# Patient Record
Sex: Female | Born: 1950 | Race: White | Hispanic: No | Marital: Married | Smoking: Never smoker
Health system: Southern US, Community
[De-identification: ages and names within clinical notes are randomized; demographics above are authoritative.]

## PROBLEM LIST (undated history)

## (undated) DIAGNOSIS — L719 Rosacea, unspecified: Secondary | ICD-10-CM

## (undated) DIAGNOSIS — Z9289 Personal history of other medical treatment: Secondary | ICD-10-CM

## (undated) DIAGNOSIS — G4700 Insomnia, unspecified: Secondary | ICD-10-CM

## (undated) DIAGNOSIS — E785 Hyperlipidemia, unspecified: Secondary | ICD-10-CM

## (undated) DIAGNOSIS — K5790 Diverticulosis of intestine, part unspecified, without perforation or abscess without bleeding: Secondary | ICD-10-CM

## (undated) DIAGNOSIS — R748 Abnormal levels of other serum enzymes: Secondary | ICD-10-CM

## (undated) DIAGNOSIS — G43109 Migraine with aura, not intractable, without status migrainosus: Secondary | ICD-10-CM

## (undated) HISTORY — DX: Migraine with aura, not intractable, without status migrainosus: G43.109

## (undated) HISTORY — DX: Diverticulosis of intestine, part unspecified, without perforation or abscess without bleeding: K57.90

## (undated) HISTORY — DX: Insomnia, unspecified: G47.00

## (undated) HISTORY — DX: Rosacea, unspecified: L71.9

## (undated) HISTORY — DX: Personal history of other medical treatment: Z92.89

## (undated) HISTORY — DX: Hyperlipidemia, unspecified: E78.5

## (undated) HISTORY — DX: Abnormal levels of other serum enzymes: R74.8

---

## 1994-11-18 HISTORY — PX: KNEE SURGERY: SHX244

## 1998-04-14 ENCOUNTER — Other Ambulatory Visit: Admission: RE | Admit: 1998-04-14 | Discharge: 1998-04-14 | Payer: Self-pay | Admitting: *Deleted

## 1998-05-18 ENCOUNTER — Other Ambulatory Visit: Admission: RE | Admit: 1998-05-18 | Discharge: 1998-05-18 | Payer: Self-pay | Admitting: *Deleted

## 1999-02-16 ENCOUNTER — Other Ambulatory Visit: Admission: RE | Admit: 1999-02-16 | Discharge: 1999-02-16 | Payer: Self-pay | Admitting: *Deleted

## 1999-07-04 ENCOUNTER — Other Ambulatory Visit: Admission: RE | Admit: 1999-07-04 | Discharge: 1999-07-04 | Payer: Self-pay | Admitting: *Deleted

## 2000-09-23 ENCOUNTER — Other Ambulatory Visit: Admission: RE | Admit: 2000-09-23 | Discharge: 2000-09-23 | Payer: Self-pay | Admitting: *Deleted

## 2001-12-14 ENCOUNTER — Other Ambulatory Visit: Admission: RE | Admit: 2001-12-14 | Discharge: 2001-12-14 | Payer: Self-pay | Admitting: *Deleted

## 2002-11-18 DIAGNOSIS — G43109 Migraine with aura, not intractable, without status migrainosus: Secondary | ICD-10-CM

## 2002-11-18 HISTORY — DX: Migraine with aura, not intractable, without status migrainosus: G43.109

## 2002-12-23 ENCOUNTER — Other Ambulatory Visit: Admission: RE | Admit: 2002-12-23 | Discharge: 2002-12-23 | Payer: Self-pay | Admitting: *Deleted

## 2003-01-03 ENCOUNTER — Encounter: Payer: Self-pay | Admitting: Family Medicine

## 2003-01-03 ENCOUNTER — Encounter: Admission: RE | Admit: 2003-01-03 | Discharge: 2003-01-03 | Payer: Self-pay | Admitting: Family Medicine

## 2003-06-14 ENCOUNTER — Ambulatory Visit (HOSPITAL_COMMUNITY): Admission: RE | Admit: 2003-06-14 | Discharge: 2003-06-14 | Payer: Self-pay | Admitting: Neurology

## 2003-06-14 ENCOUNTER — Encounter: Payer: Self-pay | Admitting: Neurology

## 2004-03-26 ENCOUNTER — Other Ambulatory Visit: Admission: RE | Admit: 2004-03-26 | Discharge: 2004-03-26 | Payer: Self-pay | Admitting: *Deleted

## 2004-11-18 DIAGNOSIS — R748 Abnormal levels of other serum enzymes: Secondary | ICD-10-CM

## 2004-11-18 DIAGNOSIS — K5790 Diverticulosis of intestine, part unspecified, without perforation or abscess without bleeding: Secondary | ICD-10-CM

## 2004-11-18 HISTORY — PX: EYE MUSCLE SURGERY: SHX370

## 2004-11-18 HISTORY — DX: Diverticulosis of intestine, part unspecified, without perforation or abscess without bleeding: K57.90

## 2004-11-18 HISTORY — DX: Abnormal levels of other serum enzymes: R74.8

## 2004-11-18 HISTORY — PX: CATARACT EXTRACTION: SUR2

## 2005-04-16 ENCOUNTER — Other Ambulatory Visit: Admission: RE | Admit: 2005-04-16 | Discharge: 2005-04-16 | Payer: Self-pay | Admitting: *Deleted

## 2005-06-12 HISTORY — PX: COLONOSCOPY: SHX174

## 2006-04-30 ENCOUNTER — Other Ambulatory Visit: Admission: RE | Admit: 2006-04-30 | Discharge: 2006-04-30 | Payer: Self-pay | Admitting: *Deleted

## 2007-06-09 ENCOUNTER — Other Ambulatory Visit: Admission: RE | Admit: 2007-06-09 | Discharge: 2007-06-09 | Payer: Self-pay | Admitting: *Deleted

## 2008-07-20 ENCOUNTER — Other Ambulatory Visit: Admission: RE | Admit: 2008-07-20 | Discharge: 2008-07-20 | Payer: Self-pay | Admitting: Gynecology

## 2011-11-19 DIAGNOSIS — E785 Hyperlipidemia, unspecified: Secondary | ICD-10-CM

## 2011-11-19 HISTORY — DX: Hyperlipidemia, unspecified: E78.5

## 2011-12-11 DIAGNOSIS — Z9289 Personal history of other medical treatment: Secondary | ICD-10-CM

## 2011-12-11 HISTORY — DX: Personal history of other medical treatment: Z92.89

## 2012-11-18 DIAGNOSIS — L719 Rosacea, unspecified: Secondary | ICD-10-CM

## 2012-11-18 HISTORY — DX: Rosacea, unspecified: L71.9

## 2012-12-09 ENCOUNTER — Other Ambulatory Visit: Payer: Self-pay | Admitting: Family Medicine

## 2012-12-09 ENCOUNTER — Ambulatory Visit
Admission: RE | Admit: 2012-12-09 | Discharge: 2012-12-09 | Disposition: A | Discharge status: Home / Self Care | Payer: BC Managed Care – PPO | Source: Ambulatory Visit | Attending: Family Medicine | Admitting: Family Medicine

## 2012-12-09 DIAGNOSIS — M25559 Pain in unspecified hip: Secondary | ICD-10-CM

## 2012-12-09 DIAGNOSIS — M545 Low back pain: Secondary | ICD-10-CM

## 2014-12-13 DIAGNOSIS — Z9289 Personal history of other medical treatment: Secondary | ICD-10-CM

## 2014-12-13 HISTORY — DX: Personal history of other medical treatment: Z92.89

## 2015-01-03 ENCOUNTER — Telehealth: Payer: Self-pay | Admitting: Cardiology

## 2015-01-03 NOTE — Telephone Encounter (Signed)
Received records from Dr Mosetta PuttPeter Blomgren for appointment on 01/04/15 with Dr Antoine PocheHochrein.  Records given to N. Hines (medical records) for Dr Hochrein's schedule on 01/04/15.  lp

## 2015-01-04 ENCOUNTER — Encounter: Payer: Self-pay | Admitting: Cardiology

## 2015-01-04 ENCOUNTER — Ambulatory Visit (INDEPENDENT_AMBULATORY_CARE_PROVIDER_SITE_OTHER): Payer: No Typology Code available for payment source | Admitting: Cardiology

## 2015-01-04 VITALS — BP 122/86 | HR 62 | Ht 63.0 in | Wt 156.0 lb

## 2015-01-04 DIAGNOSIS — R0789 Other chest pain: Secondary | ICD-10-CM

## 2015-01-04 NOTE — Progress Notes (Signed)
Cardiology Office Note   Date:  01/04/2015   ID:  Chelsea Cline, DOB November 18, 1951, MRN [id number]  PCP:  Carolyne Fiscal, MD  Cardiologist:   Rollene Rotunda, MD   Chief Complaint  Patient presents with  . New Patient    Pt was referred by Dr Geoffery Lyons for abnormal EKG      History of Present Illness: Chelsea Cline is a 64 y.o. female who presents for evaluation of an abnormal EKG. The patient has no prior cardiac history. She does report having had a stress test several years ago that she was told was normal. She was having some rapid heart rate with exercise at that time. She's now noted to have an abnormal EKG with some T-wave inversion in the anterior leads and an incomplete right bundle branch block. This was apparently new compared with previous EKGs.  The patient has no cardiac symptoms. She is active. She exercises and plays doubles tennis. With this she denies any cardiovascular symptoms. The patient denies any new symptoms such as chest discomfort, neck or arm discomfort. There has been no new shortness of breath, PND or orthopnea. There have been no reported palpitations, presyncope or syncope.      Past Medical History  Diagnosis Date  . Ocular migraine 2004  . Insomnia     Several years  . Elevated alkaline phosphatase level 2006  . Diverticulosis 2006    Noted on Colonoscopy which has been asymptomatic.  Marland Kitchen Hyperlipidemia 2013  . Rosacea 2014  . H/O mammogram 12/13/2014  . H/O bone density study 12/11/2011    Osteopenia    Past Surgical History  Procedure Laterality Date  . Colonoscopy  06/12/2005    Showed minimal diverticulosis and a Hyperplastic polyp.  . Knee surgery Left 1996    for Chondromalacia  . Eye muscle surgery Left 2006    for repair of a macular pucker  . Cataract extraction Left 2006    Lens Implant     Current Outpatient Prescriptions  Medication Sig Dispense Refill  . ALPRAZolam (XANAX) 0.25 MG tablet Take 0.25 mg by mouth at  bedtime as needed for anxiety.    Marland Kitchen aspirin 81 MG tablet Take 81 mg by mouth daily.    . Calcium Carbonate-Vitamin D (CALTRATE 600+D PO) Take 2 tablets by mouth daily.    . Glucosamine-Chondroitin (COSAMIN DS PO) Take 1-2 capsules by mouth daily.    Marland Kitchen lovastatin (MEVACOR) 10 MG tablet Take 10 mg by mouth at bedtime.    . multivitamin-lutein (OCUVITE-LUTEIN) CAPS capsule Take 1 capsule by mouth daily.     No current facility-administered medications for this visit.    Allergies:   Review of patient's allergies indicates no known allergies.    Social History:  The patient  reports that she has never smoked. She does not have any smokeless tobacco history on file. She reports that she drinks about 3.0 oz of alcohol per week. She reports that she does not use illicit drugs.   Family History:  The patient's family history includes Cancer - Other in her mother; Diabetes in her father; Hypertension in her father; Stroke in her mother.    ROS:  Please see the history of present illness.   Otherwise, review of systems are positive for occasional leg swelling, tinnitus..   All other systems are reviewed and negative.    PHYSICAL EXAM: VS:  BP 122/86 mmHg  Pulse 62  Ht  (1.6 m)  Wt 156  lb (70.761 kg)  BMI 27.64 kg/m2 , BMI Body mass index is 27.64 kg/(m^2). GEN: Well nourished, well developed, in no acute distress HEENT: normal Neck: no JVD, carotid bruits, or masses Cardiac: RRR; no murmurs, rubs, or gallops,no edema  Respiratory:  clear to auscultation bilaterally, normal work of breathing GI: soft, nontender, nondistended, + BS MS: no deformity or atrophy Skin: warm and dry, no rash Neuro:  Strength and sensation are intact Psych: euthymic mood, full affect   EKG:  EKG is ordered today. The ekg ordered today demonstrates sinus rhythm, rate 62, incomplete right bundle branch block, T-wave inversion in the anterior leads unchanged from the January EKG..    Lipid Panel Total  cholesterol 249, triglycerides 100, LDL 154 HDL 72    Wt Readings from Last 3 Encounters:  01/04/15 156 lb (70.761 kg)      Other studies Reviewed: Additional studies/ records that were reviewed today include: Office records from Carolyne FiscalBLOMGREN,PETER F, MD. Review of the above records demonstrates: As above.    ASSESSMENT AND PLAN: ABNORMAL EKG:  The patient does have an abnormal EKG but no symptoms. She does have possible risk factors of family history although her fathers's  sudden death was of unclear etiology. She does need to be screened. I will bring her back for a POET (Plain Old Exercise Treadmill).    DYSLIPIDEMIA:  She does have dyslipidemia as above. I agree with statin treatment if one is found that she tolerates. I think she meets criteria for primary risk reduction  Current medicines are reviewed at length with the patient today.  The patient does not have concerns regarding medicines.  The following changes have been made:  no change  Labs/ tests ordered today include: POET (Plain Old Exercise Treadmill)  No orders of the defined types were placed in this encounter.     Disposition:   FU with me as needed.   Signed, Rollene RotundaJames Tyshawn Ciullo, MD  01/04/2015 4:26 PM    [facility]

## 2015-01-04 NOTE — Patient Instructions (Signed)
Your physician recommends that you schedule a follow-up appointment in: as needed with Dr. Hochrein  We are ordering a stress test for you to get done    

## 2015-01-17 ENCOUNTER — Telehealth: Payer: Self-pay | Admitting: Cardiology

## 2015-01-26 ENCOUNTER — Telehealth (HOSPITAL_COMMUNITY): Payer: Self-pay

## 2015-01-26 NOTE — Telephone Encounter (Signed)
Encounter complete. 

## 2015-01-30 ENCOUNTER — Ambulatory Visit: Payer: Self-pay | Admitting: Cardiology

## 2015-01-31 ENCOUNTER — Ambulatory Visit (HOSPITAL_COMMUNITY)
Admission: RE | Admit: 2015-01-31 | Discharge: 2015-01-31 | Disposition: A | Discharge status: Home / Self Care | Payer: Medicare Other | Source: Ambulatory Visit | Attending: Cardiology | Admitting: Cardiology

## 2015-01-31 DIAGNOSIS — R0789 Other chest pain: Secondary | ICD-10-CM | POA: Diagnosis not present

## 2015-01-31 NOTE — Procedures (Signed)
Exercise Treadmill Test Test  Exercise Tolerance Test Ordering MD: Angelina SheriffJake Hochrein, MD    Unique Test No: 1  Treadmill:  1  Indication for ETT: ABN EKG  Contraindication to ETT: No   Stress Modality: exercise - treadmill  Cardiac Imaging Performed: non   Protocol: standard Bruce - maximal  Max BP:  181/106  Max MPHR (bpm):  156 85% MPR (bpm):  133  MPHR obtained (bpm):  162 % MPHR obtained:  103  Reached 85% MPHR (min:sec):  4:30 Total Exercise Time (min-sec):  8  Workload in METS:  10.1 Borg Scale: 14-15  Reason ETT Terminated:  SOB, THR obtained    ST Segment Analysis At Rest: NSR, IRBBB, nonspecific ST changes With Exercise: no evidence of significant ST depression  Other Information Arrhythmia:  Rare PAC. Angina during ETT:  absent (0) Quality of ETT:  diagnostic  ETT Interpretation:  normal - no evidence of ischemia by ST analysis  Comments: ETT with fair exercise tolerance (8:00); normal BP response; no chest pain; no ST changes; negative adequate ETT.  Olga MillersBrian Crenshaw

## 2015-06-07 NOTE — Telephone Encounter (Signed)
Error

## 2016-04-16 ENCOUNTER — Other Ambulatory Visit: Payer: Self-pay | Admitting: Family Medicine

## 2016-04-16 DIAGNOSIS — R109 Unspecified abdominal pain: Secondary | ICD-10-CM

## 2016-04-17 ENCOUNTER — Ambulatory Visit
Admission: RE | Admit: 2016-04-17 | Discharge: 2016-04-17 | Disposition: A | Discharge status: Home / Self Care | Payer: Medicare Other | Source: Ambulatory Visit | Attending: Family Medicine | Admitting: Family Medicine

## 2016-04-17 DIAGNOSIS — R109 Unspecified abdominal pain: Secondary | ICD-10-CM

## 2018-02-18 DIAGNOSIS — H9313 Tinnitus, bilateral: Secondary | ICD-10-CM | POA: Insufficient documentation

## 2018-10-09 DIAGNOSIS — M25569 Pain in unspecified knee: Secondary | ICD-10-CM | POA: Insufficient documentation

## 2018-11-09 DIAGNOSIS — M179 Osteoarthritis of knee, unspecified: Secondary | ICD-10-CM | POA: Insufficient documentation

## 2018-11-09 DIAGNOSIS — M1710 Unilateral primary osteoarthritis, unspecified knee: Secondary | ICD-10-CM | POA: Insufficient documentation

## 2018-12-18 DIAGNOSIS — M2392 Unspecified internal derangement of left knee: Secondary | ICD-10-CM | POA: Insufficient documentation

## 2019-05-04 ENCOUNTER — Other Ambulatory Visit: Payer: Self-pay | Admitting: Family Medicine

## 2019-05-04 ENCOUNTER — Ambulatory Visit
Admission: RE | Admit: 2019-05-04 | Discharge: 2019-05-04 | Disposition: A | Discharge status: Home / Self Care | Payer: Medicare Other | Source: Ambulatory Visit | Attending: Family Medicine | Admitting: Family Medicine

## 2019-05-04 DIAGNOSIS — M25552 Pain in left hip: Secondary | ICD-10-CM

## 2019-05-27 DIAGNOSIS — H8111 Benign paroxysmal vertigo, right ear: Secondary | ICD-10-CM | POA: Insufficient documentation

## 2019-06-01 ENCOUNTER — Other Ambulatory Visit: Payer: Self-pay

## 2019-06-01 ENCOUNTER — Ambulatory Visit (INDEPENDENT_AMBULATORY_CARE_PROVIDER_SITE_OTHER): Payer: Medicare Other | Admitting: Physical Medicine and Rehabilitation

## 2019-06-01 ENCOUNTER — Encounter: Payer: Self-pay | Admitting: Physical Medicine and Rehabilitation

## 2019-06-01 ENCOUNTER — Ambulatory Visit: Payer: Self-pay

## 2019-06-01 DIAGNOSIS — G8929 Other chronic pain: Secondary | ICD-10-CM

## 2019-06-01 DIAGNOSIS — M7062 Trochanteric bursitis, left hip: Secondary | ICD-10-CM

## 2019-06-01 DIAGNOSIS — M7918 Myalgia, other site: Secondary | ICD-10-CM

## 2019-06-01 DIAGNOSIS — M25562 Pain in left knee: Secondary | ICD-10-CM

## 2019-06-01 DIAGNOSIS — M545 Low back pain, unspecified: Secondary | ICD-10-CM

## 2019-06-01 NOTE — Progress Notes (Signed)
.  Numeric Pain Rating Scale and Functional Assessment Average Pain 7   In the last MONTH (on 0-10 scale) has pain interfered with the following?  1. General activity like being  able to carry out your everyday physical activities such as walking, climbing stairs, carrying groceries, or moving a chair?  Rating(4)   -Dye Allergies.  

## 2019-06-01 NOTE — Progress Notes (Signed)
Chelsea Cline - 68 y.o. female MRN [id number]  Date of birth: May 09, 1951  Office Visit Note: Visit Date: 06/01/2019 PCP: Derinda Late, MD Referred by: Derinda Late, MD  Subjective: Chief Complaint  Patient presents with   Left Hip - Pain   HPI:  Chelsea Cline is a 68 y.o. female who comes in today At the request of Dr. Derinda Late for evaluation management of left lateral hip pain.  She reports history of 3 months of worsening lateral hip pain.  She denies any specific injury or trauma.  She denies any groin pain.  She has no paresthesias numbness or tingling or focal weakness.  She really has difficulty laying on the left side and that makes the pain tremendously worse.  She denies any right-sided complaints.  She does report that walking actually seems to help the pain to a degree.  She rates her pain as a 7 out of 10.  She has failed medication management and is pretty active although she is not had specific physical therapy for the hip.  She also reports left knee osteoarthritis which is treated from an orthopedic standpoint at [facility] near [facility] where she lives.  Review of Systems  Constitutional: Negative for chills, fever, malaise/fatigue and weight loss.  HENT: Negative for hearing loss and sinus pain.   Eyes: Negative for blurred vision, double vision and photophobia.  Respiratory: Negative for cough and shortness of breath.   Cardiovascular: Negative for chest pain, palpitations and leg swelling.  Gastrointestinal: Negative for abdominal pain, nausea and vomiting.  Genitourinary: Negative for flank pain.  Musculoskeletal: Positive for joint pain. Negative for myalgias.  Skin: Negative for itching and rash.  Neurological: Negative for tremors, focal weakness and weakness.  Endo/Heme/Allergies: Negative.   Psychiatric/Behavioral: Negative for depression.  All other systems reviewed and are negative.  Otherwise per HPI.  Assessment & Plan: Visit  Diagnoses:  1. Greater trochanteric bursitis, left   2. Chronic right-sided low back pain without sciatica   3. Myofascial pain syndrome   4. Chronic pain of left knee     Plan: Findings:  Clinical exam findings and history consistent with a left greater trochanteric pain syndrome or bursitis.  We did spend time talking about natural history of bursitis and sources of pain and at least how to treat that.  I think the fact that she is really not gotten much relief and this is pretty problematic at this point with a pain scale of 7 out of 10 it is worth completing diagnostic and hopefully therapeutic injection with fluoroscopic guidance.  Fluoroscopic guidance is justified given body habitus.  Depending on relief could look at foam roller and physical therapy consultation.  Repeat injection could be done if it is symptomatic relief for a good while.  If there is no relief further work-up would be needed for potential for radiculitis.    Meds & Orders: No orders of the defined types were placed in this encounter.   Orders Placed This Encounter  Procedures   Large Joint Inj: L greater trochanter   XR C-ARM NO REPORT    Follow-up: Return if symptoms worsen or fail to improve.   Procedures: Large Joint Inj: L greater trochanter on 06/01/2019 2:48 PM Indications: pain and diagnostic evaluation Details: 22 G 3.5 in needle, fluoroscopy-guided lateral approach  Arthrogram: No  Medications: 4 mL bupivacaine 0.25 %; 4 mL lidocaine 2 %; 80 mg triamcinolone acetonide 40 MG/ML Outcome: tolerated well, no immediate complications  There was excellent flow of contrast outlined the greater trochanteric bursa without vascular uptake. Procedure, treatment alternatives, risks and benefits explained, specific risks discussed. Consent was given by the patient. Immediately prior to procedure a time out was called to verify the correct patient, procedure, equipment, support staff and site/side marked as  required. Patient was prepped and draped in the usual sterile fashion.      No notes on file   Clinical History: No specialty comments available.     Objective:  VS:  HT:     WT:    BMI:      BP:    HR: bpm   TEMP: ( )   RESP:  Physical Exam Vitals signs and nursing note reviewed.  Constitutional:      General: She is not in acute distress.    Appearance: Normal appearance. She is well-developed.  HENT:     Head: Normocephalic and atraumatic.     Nose: Nose normal.     Mouth/Throat:     Pharynx: Oropharynx is clear.  Eyes:     Conjunctiva/sclera: Conjunctivae normal.     Pupils: Pupils are equal, round, and reactive to light.  Neck:     Musculoskeletal: Normal range of motion and neck supple.  Cardiovascular:     Rate and Rhythm: Regular rhythm.  Pulmonary:     Effort: Pulmonary effort is normal. No respiratory distress.  Abdominal:     General: There is no distension.     Palpations: Abdomen is soft.     Tenderness: There is no guarding.  Musculoskeletal:     Right lower leg: No edema.     Left lower leg: No edema.     Comments: Patient has very little pain on extension and facet loading of the lumbar spine and there is no pain with hip rotation but there is pain over the greater trochanter that is pretty specific on the left side and is concordant with her pain.  She has good distal strength.  Skin:    General: Skin is warm and dry.     Findings: No erythema or rash.  Neurological:     General: No focal deficit present.     Mental Status: She is alert and oriented to person, place, and time.     Sensory: No sensory deficit.     Motor: No abnormal muscle tone.     Coordination: Coordination normal.     Gait: Gait normal.  Psychiatric:        Mood and Affect: Mood normal.        Behavior: Behavior normal.        Thought Content: Thought content normal.     Ortho Exam Imaging: No results found.

## 2019-06-03 ENCOUNTER — Encounter: Payer: Self-pay | Admitting: Physical Medicine and Rehabilitation

## 2019-06-03 MED ORDER — LIDOCAINE HCL 2 % IJ SOLN
4.0000 mL | INTRAMUSCULAR | Status: AC | PRN
  Administered 2019-06-01: 4 mL

## 2019-06-03 MED ORDER — TRIAMCINOLONE ACETONIDE 40 MG/ML IJ SUSP
80.0000 mg | INTRAMUSCULAR | Status: AC | PRN
  Administered 2019-06-01: 80 mg via INTRA_ARTICULAR

## 2019-06-03 MED ORDER — BUPIVACAINE HCL 0.25 % IJ SOLN
4.0000 mL | INTRAMUSCULAR | Status: AC | PRN
  Administered 2019-06-01: 4 mL via INTRA_ARTICULAR

## 2019-10-08 ENCOUNTER — Other Ambulatory Visit: Payer: Self-pay

## 2019-10-08 ENCOUNTER — Ambulatory Visit: Payer: Self-pay

## 2019-10-08 ENCOUNTER — Ambulatory Visit (INDEPENDENT_AMBULATORY_CARE_PROVIDER_SITE_OTHER): Payer: Medicare Other | Admitting: Physical Medicine and Rehabilitation

## 2019-10-08 ENCOUNTER — Encounter: Payer: Self-pay | Admitting: Physical Medicine and Rehabilitation

## 2019-10-08 DIAGNOSIS — M25552 Pain in left hip: Secondary | ICD-10-CM

## 2019-10-08 NOTE — Progress Notes (Signed)
.  Numeric Pain Rating Scale and Functional Assessment Average Pain 4   In the last MONTH (on 0-10 scale) has pain interfered with the following?  1. General activity like being  able to carry out your everyday physical activities such as walking, climbing stairs, carrying groceries, or moving a chair?  Rating(5)    -Dye Allergies.  

## 2019-10-11 DIAGNOSIS — M25552 Pain in left hip: Secondary | ICD-10-CM

## 2019-10-11 MED ORDER — BUPIVACAINE HCL 0.25 % IJ SOLN
4.0000 mL | INTRAMUSCULAR | Status: AC | PRN
  Administered 2019-10-11: 06:00:00 4 mL via INTRA_ARTICULAR

## 2019-10-11 MED ORDER — TRIAMCINOLONE ACETONIDE 40 MG/ML IJ SUSP
60.0000 mg | INTRAMUSCULAR | Status: AC | PRN
  Administered 2019-10-11: 06:00:00 60 mg via INTRA_ARTICULAR

## 2019-10-11 NOTE — Progress Notes (Signed)
Chelsea Cline - 68 y.o. female MRN [id number]  Date of birth: 1951-05-14  Office Visit Note: Visit Date: 10/08/2019 PCP: Mosetta Putt, MD Referred by: Mosetta Putt, MD  Subjective: Chief Complaint  Patient presents with  . Left Hip - Pain  . Left Thigh - Pain   HPI:  Chelsea Cline is a 68 y.o. female who comes in today For planned diagnostic and hopefully therapeutic anesthetic hip arthrogram on the left.  Patient comes in at the request of Dr. Mosetta Putt with worsening hip pain but now some groin pain with movement.  Some referral pain into the thigh without tingling numbness or paresthesia.  She does feel like there was a point where maybe she did rotate with the hip and this seemed to have started back in September of this year.  Interestingly she does get some symptoms of she has been sitting for a long time.  Average pain is 4 out of 10.  I seen her in the past for greater trochanteric injection which was beneficial but now her symptoms have changed.  Depending on relief with the injection would suggest MRI arthrogram of the hip joint.  X-ray of the hip in June showed only mild degenerative change.  ROS Otherwise per HPI.  Assessment & Plan: Visit Diagnoses:  1. Pain in left hip     Plan: Findings:  Excellent arthrogram of the joint showing no extravasation and the patient did seem to have some relief during the anesthetic phase of the injection.  She is going to monitor this today and then see how she does with the cortisone part of the injection.  She can follow-up with myself or Dr. Duaine Dredge.    Meds & Orders: No orders of the defined types were placed in this encounter.   Orders Placed This Encounter  Procedures  . C-ARM NO Order    Follow-up: No follow-ups on file.   Procedures: Large Joint Inj: L hip joint on 10/11/2019 5:48 AM Indications: pain and diagnostic evaluation Details: 22 G needle, anterior approach  Arthrogram: Yes  Medications: 4 mL  bupivacaine 0.25 %; 60 mg triamcinolone acetonide 40 MG/ML Outcome: tolerated well, no immediate complications  Arthrogram demonstrated excellent flow of contrast throughout the joint surface without extravasation or obvious defect.  The patient had relief of symptoms during the anesthetic phase of the injection.  Procedure, treatment alternatives, risks and benefits explained, specific risks discussed. Consent was given by the patient. Immediately prior to procedure a time out was called to verify the correct patient, procedure, equipment, support staff and site/side marked as required. Patient was prepped and draped in the usual sterile fashion.      No notes on file   Clinical History: DG HIP (WITH OR WITHOUT PELVIS) 2-3V LEFT  COMPARISON:  None.  FINDINGS: There is no acute displaced fracture or dislocation. There are mild degenerative changes of the left hip. The osseous mineralization is within normal limits. There is a metallic foreign body that projects over the midline sacrum.  IMPRESSION: 1. No acute displaced fracture or dislocation. 2. Mild degenerative changes are noted of the left hip. 3. Metallic foreign body projects over the midline sacrum. This may be external to the patient but should be correlated with physical exam.   Electronically Signed   By: Katherine Mantle M.D.   On: 05/04/2019 13:47     Objective:  VS:  HT:    WT:   BMI:     BP:  HR: bpm  TEMP: ( )  RESP:  Physical Exam Musculoskeletal:     Right lower leg: No edema.     Left lower leg: No edema.     Comments: Some discomfort with hip rotation but not exquisite.  Some pain over the greater trochanter.  Good distal strength.  Skin:    General: Skin is warm and dry.     Findings: No erythema, lesion or rash.  Neurological:     General: No focal deficit present.  Psychiatric:        Mood and Affect: Mood normal.        Behavior: Behavior normal.     Ortho Exam Imaging: No  results found.

## 2019-10-26 ENCOUNTER — Other Ambulatory Visit: Payer: Self-pay | Admitting: Family Medicine

## 2019-10-26 DIAGNOSIS — M25552 Pain in left hip: Secondary | ICD-10-CM

## 2019-11-03 ENCOUNTER — Ambulatory Visit
Admission: RE | Admit: 2019-11-03 | Discharge: 2019-11-03 | Disposition: A | Discharge status: Home / Self Care | Payer: Medicare Other | Source: Ambulatory Visit | Attending: Family Medicine | Admitting: Family Medicine

## 2019-11-03 ENCOUNTER — Other Ambulatory Visit: Payer: Self-pay

## 2019-11-03 DIAGNOSIS — M25552 Pain in left hip: Secondary | ICD-10-CM

## 2019-11-29 ENCOUNTER — Ambulatory Visit (INDEPENDENT_AMBULATORY_CARE_PROVIDER_SITE_OTHER): Payer: Medicare Other | Admitting: Physical Medicine and Rehabilitation

## 2019-11-29 ENCOUNTER — Encounter: Payer: Self-pay | Admitting: Physical Medicine and Rehabilitation

## 2019-11-29 ENCOUNTER — Ambulatory Visit: Payer: Self-pay

## 2019-11-29 ENCOUNTER — Other Ambulatory Visit: Payer: Self-pay

## 2019-11-29 DIAGNOSIS — M461 Sacroiliitis, not elsewhere classified: Secondary | ICD-10-CM | POA: Insufficient documentation

## 2019-11-29 DIAGNOSIS — S76012D Strain of muscle, fascia and tendon of left hip, subsequent encounter: Secondary | ICD-10-CM

## 2019-11-29 DIAGNOSIS — M79652 Pain in left thigh: Secondary | ICD-10-CM | POA: Diagnosis not present

## 2019-11-29 DIAGNOSIS — M47818 Spondylosis without myelopathy or radiculopathy, sacral and sacrococcygeal region: Secondary | ICD-10-CM | POA: Diagnosis not present

## 2019-11-29 DIAGNOSIS — M7062 Trochanteric bursitis, left hip: Secondary | ICD-10-CM

## 2019-11-29 DIAGNOSIS — S76012A Strain of muscle, fascia and tendon of left hip, initial encounter: Secondary | ICD-10-CM | POA: Insufficient documentation

## 2019-11-29 MED ORDER — BUPIVACAINE HCL 0.5 % IJ SOLN
3.0000 mL | INTRAMUSCULAR | Status: AC | PRN
  Administered 2019-11-29: 3 mL via INTRA_ARTICULAR

## 2019-11-29 MED ORDER — BUPIVACAINE HCL 0.25 % IJ SOLN
4.0000 mL | INTRAMUSCULAR | Status: AC | PRN
  Administered 2019-11-29: 4 mL via INTRA_ARTICULAR

## 2019-11-29 MED ORDER — TRIAMCINOLONE ACETONIDE 40 MG/ML IJ SUSP
40.0000 mg | INTRAMUSCULAR | Status: AC | PRN
  Administered 2019-11-29: 40 mg via INTRA_ARTICULAR

## 2019-11-29 NOTE — Progress Notes (Signed)
.  Numeric Pain Rating Scale and Functional Assessment Average Pain 5   In the last MONTH (on 0-10 scale) has pain interfered with the following?  1. General activity like being  able to carry out your everyday physical activities such as walking, climbing stairs, carrying groceries, or moving a chair?  Rating(8)   -Dye Allergies.  

## 2019-11-29 NOTE — Progress Notes (Signed)
Chelsea Cline - 69 y.o. female MRN [id number]  Date of birth: [birth year]  Office Visit Note: Visit Date: 11/29/2019 PCP: Derinda Late, MD Referred by: Derinda Late, MD  Subjective: Chief Complaint  Patient presents with  . Left Hip - Pain  . Left Leg - Pain   HPI: Chelsea Cline is a 69 y.o. female who comes in today For reevaluation management of the left buttock hip and thigh pain which is chronic and worsening.  This is requested by her primary care physician Dr. Derinda Late.  The last time I saw her we did complete intra-articular anesthetic hip arthrogram with questionable relief during the anesthetic phase but the patient says really did not seem to help that much overall.  Prior greater trochanteric bursa injection under fluoroscopic guidance was a big relief.  Since have seen her she has had MRI of the hip without contrast.  This is reviewed with the patient and reviewed below.  As of note it does show significant tear of the gluteus medius tendon insertion to the greater trochanter.  There is also bursitis fluid.  Not much in the way of internal derangement of the hip.  She still complains of left posterior buttock pain referral into the trochanteric area into the thigh to the knee.  No real groin pain.  No paresthesias.  No real pain past the knee.  Worse with walking and standing and going from sit to stand to sitting for a while.  She denies any right-sided complaints.  No focal weakness.  No other red flag complaints.  She used to play a lot of tennis and has not been doing that.  She is to walk and she is not been doing that as well.  We had a long discussion today about her case and future.  We spoke face-to-face directly for 20 minutes about her care and treatment of the left hip and bursa pain.  Review of Systems  Constitutional: Negative for chills, fever, malaise/fatigue and weight loss.  HENT: Negative for hearing loss and sinus pain.   Eyes: Negative for blurred  vision, double vision and photophobia.  Respiratory: Negative for cough and shortness of breath.   Cardiovascular: Negative for chest pain, palpitations and leg swelling.  Gastrointestinal: Negative for abdominal pain, nausea and vomiting.  Genitourinary: Negative for flank pain.  Musculoskeletal: Positive for back pain and joint pain. Negative for myalgias.  Skin: Negative for itching and rash.  Neurological: Negative for tremors, focal weakness and weakness.  Endo/Heme/Allergies: Negative.   Psychiatric/Behavioral: Negative for depression.  All other systems reviewed and are negative.  Otherwise per HPI.  Assessment & Plan: Visit Diagnoses:  1. Greater trochanteric bursitis, left   2. Tear of left gluteus medius tendon, subsequent encounter   3. 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 Left thigh pain     Plan: Findings:  Chronic worsening severe left posterior lateral hip pain which in the past has been consistent with greater trochanteric bursitis or pain syndrome.  She had an episode where she had turned and felt a pop or tearing sensation.  Now that she has had MRI of that area this does prove that she did have a tear of the gluteus medius and partial tear of the gluteus minimus.  She does have fluid around the bursa.  Internal derangement of the hip is pretty minimal.  At this point we are going to complete diagnostic and hopefully therapeutic greater trochanteric injection with fluoroscopic guidance.  She is going to slowly increase activities including walking and tennis.  The tenderness needs to be done at a fairly slow increasing pace and I went over that with her at great detail.  We talked about strengthening exercises for the gluteus medius.  This is been 4 months and I think this tear is probably at a point where it should have scarred down.  I am not sure of any surgical approaches to that but would favor having her see Dr. Doneen Poisson for evaluation depending on  relief with injection and return to walking and tennis.  Other options would be proliferative type injection with Dr. Casimiro Needle Hilts using ultrasound and something such as PRP or prolotherapy.  I am still not at a point where I fully recommend regenerative type treatment although I think that is going to show some progress in the future.    Meds & Orders: No orders of the defined types were placed in this encounter.   Orders Placed This Encounter  Procedures  . Large Joint Inj: L greater trochanter  . C-ARM NO Order    Follow-up: Return if symptoms worsen or fail to improve.   Procedures: Large Joint Inj: L greater trochanter on 11/29/2019 11:35 AM Indications: pain and diagnostic evaluation Details: 22 G 3.5 in needle, fluoroscopy-guided lateral approach  Arthrogram: No  Medications: 4 mL bupivacaine 0.25 %; 40 mg triamcinolone acetonide 40 MG/ML; 3 mL bupivacaine 0.5 % Outcome: tolerated well, no immediate complications  There was excellent flow of contrast outlined the greater trochanteric bursa without vascular uptake. Procedure, treatment alternatives, risks and benefits explained, specific risks discussed. Consent was given by the patient. Immediately prior to procedure a time out was called to verify the correct patient, procedure, equipment, support staff and site/side marked as required. Patient was prepped and draped in the usual sterile fashion.      No notes on file   Clinical History: MR OF THE LEFT HIP WITHOUT CONTRAST  TECHNIQUE: Multiplanar, multisequence MR imaging was performed. No intravenous contrast was administered.  COMPARISON:  X-ray 05/04/2019  FINDINGS: Bones: No acute fracture. No dislocation. No femoral head AVN. Degenerative changes of the bilateral sacroiliac joints, right slightly greater than left. Disc height loss with discogenic endplate marrow changes of the visualized lower lumbar spine, incompletely evaluated.  Articular cartilage  and labrum  Articular cartilage: Cartilage thinning and surface irregularity most pronounced at the superior aspect of the left hip joint.  Labrum: Blunted, heterogeneous appearance of the anterosuperior labrum compatible with degenerative tearing.  Joint or bursal effusion  Joint effusion:  None.  Bursae: Moderate left peritrochanteric bursal fluid.  Muscles and tendons  Muscles and tendons: Full-thickness avulsion tear of the left gluteus medius tendon from the greater trochanter (series 6, image 10) with minimal retraction. A portion of the posterior most tendon fibers remain attached. Tendinosis with high-grade partial-thickness insertional and interstitial tears of the left gluteus minimus tendon. Hamstring, iliopsoas, and rectus femoris tendons are intact.  Other findings  Miscellaneous:   None.  IMPRESSION: 1. Full-thickness avulsion tear of the left gluteus medius tendon from the greater trochanter. A portion of the posterior-most tendon fibers remain attached. 2. Tendinosis with high-grade partial-thickness insertional and interstitial tears of the left gluteus minimus tendon. 3. Moderate left peritrochanteric bursal fluid. 4. 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 5. Degenerative changes of the bilateral sacroiliac joints, right slightly greater than left.   Electronically Signed   By: Vernice Jefferson.D.  On: 11/04/2019 08:46   She reports that she has never smoked. She does not have any smokeless tobacco history on file. No results for input(s): HGBA1C, LABURIC in the last 8760 hours.  Objective:  VS:  HT:    WT:   BMI:     BP:   HR: bpm  TEMP: ( )  RESP:  Physical Exam Vitals and nursing note reviewed.  Constitutional:      General: She is not in acute distress.    Appearance: Normal appearance. She is well-developed. She is not ill-appearing.  HENT:     Head: Normocephalic and  atraumatic.  Eyes:     Conjunctiva/sclera: Conjunctivae normal.     Pupils: Pupils are equal, round, and reactive to light.  Cardiovascular:     Rate and Rhythm: Normal rate.     Pulses: Normal pulses.  Pulmonary:     Effort: Pulmonary effort is normal.  Musculoskeletal:        General: Tenderness present.     Right lower leg: No edema.     Left lower leg: No edema.     Comments: Patient ambulates without aid.  Somewhat slow to rise from a seated position.  As concordant pain over the left greater trochanter and tendon insertion.  She has no pain with hip rotation at least in the groin.  This is on the left right side has no pain.  Good distal strength.  Skin:    General: Skin is warm and dry.     Findings: No erythema or rash.  Neurological:     General: No focal deficit present.     Mental Status: She is alert and oriented to person, place, and time.     Sensory: No sensory deficit.     Motor: No weakness or abnormal muscle tone.     Coordination: Coordination normal.     Gait: Gait normal.  Psychiatric:        Mood and Affect: Mood normal.        Behavior: Behavior normal.     Ortho Exam Imaging: No results found.  Past Medical/Family/Surgical/Social History: Medications & Allergies reviewed per EMR, new medications updated. Patient Active Problem List   Diagnosis Date Noted  . 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 Tear of left gluteus medius tendon 11/29/2019  . Greater trochanteric bursitis, left 11/29/2019  . Internal derangement of left knee 12/18/2018  . Osteoarthritis of knee 11/09/2018  . Knee pain 10/09/2018   Past Medical History:  Diagnosis Date  . Diverticulosis 2006   Noted on Colonoscopy which has been asymptomatic.  Marland Kitchen Elevated alkaline phosphatase level 2006  . H/O bone density study 12/11/2011   Osteopenia  . H/O mammogram 12/13/2014  . Hyperlipidemia 2013  . Insomnia    Several years  . Ocular migraine 2004  . Rosacea 2014    Family History  Problem Relation Age of Onset  . Stroke Mother   . Cancer - Other Mother        Kidney  . Diabetes Father        Died in his sleep age 82 of unkonwn cause.   Marland Kitchen Hypertension Father    Past Surgical History:  Procedure Laterality Date  . CATARACT EXTRACTION Left 2006   Lens Implant  . COLONOSCOPY  06/12/2005   Showed minimal diverticulosis and a Hyperplastic polyp.  . EYE MUSCLE SURGERY Left 2006   for repair of a macular pucker  . 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 Not on file  Tobacco Use  . Smoking status: Never Smoker  Substance and Sexual Activity  . Alcohol use: Yes    Alcohol/week: 5.0 standard drinks    Types: 5 Glasses of wine per week  . Drug use: No  . Sexual activity: Not on file

## 2020-01-03 ENCOUNTER — Ambulatory Visit: Payer: Medicare Other

## 2020-04-11 ENCOUNTER — Other Ambulatory Visit: Payer: Self-pay | Admitting: *Deleted

## 2020-04-11 ENCOUNTER — Telehealth: Payer: Self-pay | Admitting: *Deleted

## 2020-04-11 DIAGNOSIS — R002 Palpitations: Secondary | ICD-10-CM

## 2020-04-11 NOTE — Telephone Encounter (Signed)
Patient enrolled for [employer] to ship a 3 day ZIO XT long term holter monitor to her home.  Instructions reviewed with patient.

## 2020-04-17 ENCOUNTER — Ambulatory Visit (INDEPENDENT_AMBULATORY_CARE_PROVIDER_SITE_OTHER): Payer: Medicare Other

## 2020-04-17 DIAGNOSIS — R002 Palpitations: Secondary | ICD-10-CM | POA: Diagnosis not present

## 2020-04-18 ENCOUNTER — Ambulatory Visit: Payer: Medicare Other

## 2020-05-04 ENCOUNTER — Telehealth: Payer: Self-pay | Admitting: Cardiology

## 2020-05-04 NOTE — Telephone Encounter (Signed)
Chelsea Cline with Dr. Geoffery Lyons office is calling to follow up. The states their office is having issues with their fax machine and she provided a temporary fax to use once monitor results are in [phone]).

## 2020-05-04 NOTE — Telephone Encounter (Signed)
Dr. Duaine Dredge, Peter's office called for copy of monitor report.  I advised them we do not have it at this time.

## 2020-05-08 ENCOUNTER — Telehealth: Payer: Self-pay | Admitting: Cardiology

## 2020-05-08 NOTE — Telephone Encounter (Signed)
Monitor is still being processed by [employer].  They have been contacted to please expedite.  Requested by Dr. Polly Cobia office to fax results 6/22 or 6/23 to alternate fax number [phone] or [phone], as soon as results are available.

## 2020-05-08 NOTE — Telephone Encounter (Signed)
Sherri from Dr. Geoffery Lyons office called to request a copy of the patient's monitor results. Results can be faxed to [phone] or [phone]. 05/08/20 vlm

## 2020-05-10 ENCOUNTER — Ambulatory Visit: Payer: Medicare Other | Admitting: Cardiology

## 2020-05-12 ENCOUNTER — Telehealth: Payer: Self-pay | Admitting: Physical Medicine and Rehabilitation

## 2020-05-12 NOTE — Telephone Encounter (Signed)
Patient called.   Requesting an appointment for her left hip area to be looked at   Call back: [phone]

## 2020-05-15 NOTE — Telephone Encounter (Signed)
Pt called wanting another bursa injection pt had left bursa injection 11/29/19. If no new injuries/traumas ok to repeat?

## 2020-05-16 NOTE — Telephone Encounter (Signed)
Scheduled for 7/15 at 1415.

## 2020-05-16 NOTE — Telephone Encounter (Signed)
Ok 15 min, anytime

## 2020-06-01 ENCOUNTER — Ambulatory Visit: Payer: Self-pay

## 2020-06-01 ENCOUNTER — Ambulatory Visit (INDEPENDENT_AMBULATORY_CARE_PROVIDER_SITE_OTHER): Payer: Medicare Other | Admitting: Physical Medicine and Rehabilitation

## 2020-06-01 ENCOUNTER — Other Ambulatory Visit: Payer: Self-pay

## 2020-06-01 ENCOUNTER — Encounter: Payer: Self-pay | Admitting: Physical Medicine and Rehabilitation

## 2020-06-01 DIAGNOSIS — M7062 Trochanteric bursitis, left hip: Secondary | ICD-10-CM

## 2020-06-01 NOTE — Progress Notes (Signed)
Pt states pain in her left back side and lower back. Pt. States sitting for a long time and getting up. Pt states getting up and walking around.   Numeric Pain Rating Scale and Functional Assessment Average Pain 5   In the last MONTH (on 0-10 scale) has pain interfered with the following?  1. General activity like being  able to carry out your everyday physical activities such as walking, climbing stairs, carrying groceries, or moving a chair?  Rating(3)   -Driver, -BT, -Dye Allergies.

## 2020-06-02 DIAGNOSIS — M7062 Trochanteric bursitis, left hip: Secondary | ICD-10-CM

## 2020-06-05 MED ORDER — LIDOCAINE HCL 2 % IJ SOLN
4.0000 mL | INTRAMUSCULAR | Status: AC | PRN
  Administered 2020-06-02: 4 mL

## 2020-06-05 MED ORDER — TRIAMCINOLONE ACETONIDE 40 MG/ML IJ SUSP
60.0000 mg | INTRAMUSCULAR | Status: AC | PRN
  Administered 2020-06-02: 60 mg via INTRA_ARTICULAR

## 2020-06-05 MED ORDER — BUPIVACAINE HCL 0.25 % IJ SOLN
4.0000 mL | INTRAMUSCULAR | Status: AC | PRN
  Administered 2020-06-02: 4 mL via INTRA_ARTICULAR

## 2020-06-05 NOTE — Progress Notes (Signed)
Chelsea Cline - 69 y.o. female MRN [id number]  Date of birth: 1951/11/08  Office Visit Note: Visit Date: 06/01/2020 PCP: Mosetta Putt, MD Referred by: Mosetta Putt, MD  Subjective: No chief complaint on file.  HPI:  Chelsea Cline is a 69 y.o. female who comes in today For repeat greater trochanteric/gluteal median tendon injection on the left. Prior injection gave her quite a bit of relief and we have not seen her in quite some time. She was originally referred to [place] by Dr. Mosetta Putt. She reports doing much better after the last injection and really was doing quite well up until just recently. The pain is still in the lateral aspect of the left greater trochanteric area posterior laterally. Nothing past the knee no paresthesias. History of tear of the left gluteus medius tendon as well as hip pain. We will repeat the injection today and she will follow up as needed. I have suggested at this point since she is doing much better from a pain standpoint regrouping with a physical therapist for manual treatment of the hip and strengthening of the gluteus medius.  ROS Otherwise per HPI.  Assessment & Plan: Visit Diagnoses:  1. Greater trochanteric bursitis, left     Plan: No additional findings.   Meds & Orders: No orders of the defined types were placed in this encounter.   Orders Placed This Encounter  Procedures  . Large Joint Inj  . XR C-ARM NO REPORT    Follow-up: No follow-ups on file.   Procedures: Large Joint Inj: L greater trochanter on 06/02/2020 2:23 PM Indications: pain and diagnostic evaluation Details: 22 G 3.5 in needle, fluoroscopy-guided lateral approach  Arthrogram: No  Medications: 4 mL lidocaine 2 %; 4 mL bupivacaine 0.25 %; 60 mg triamcinolone acetonide 40 MG/ML Outcome: tolerated well, no immediate complications  There was excellent flow of contrast outlined the greater trochanteric bursa without vascular uptake. Procedure, treatment  alternatives, risks and benefits explained, specific risks discussed. Consent was given by the patient. Immediately prior to procedure a time out was called to verify the correct patient, procedure, equipment, support staff and site/side marked as required. Patient was prepped and draped in the usual sterile fashion.      No notes on file   Clinical History: MR OF THE LEFT HIP WITHOUT CONTRAST  TECHNIQUE: Multiplanar, multisequence MR imaging was performed. No intravenous contrast was administered.  COMPARISON:  X-ray 05/04/2019  FINDINGS: Bones: No acute fracture. No dislocation. No femoral head AVN. Degenerative changes of the bilateral sacroiliac joints, right slightly greater than left. Disc height loss with discogenic endplate marrow changes of the visualized lower lumbar spine, incompletely evaluated.  Articular cartilage and labrum  Articular cartilage: Cartilage thinning and surface irregularity most pronounced at the superior aspect of the left hip joint.  Labrum: Blunted, heterogeneous appearance of the anterosuperior labrum compatible with degenerative tearing.  Joint or bursal effusion  Joint effusion:  None.  Bursae: Moderate left peritrochanteric bursal fluid.  Muscles and tendons  Muscles and tendons: Full-thickness avulsion tear of the left gluteus medius tendon from the greater trochanter (series 6, image 10) with minimal retraction. A portion of the posterior most tendon fibers remain attached. Tendinosis with high-grade partial-thickness insertional and interstitial tears of the left gluteus minimus tendon. Hamstring, iliopsoas, and rectus femoris tendons are intact.  Other findings  Miscellaneous:   None.  IMPRESSION: 1. Full-thickness avulsion tear of the left gluteus medius tendon from the greater trochanter. A portion of the  posterior-most tendon fibers remain attached. 2. Tendinosis with high-grade partial-thickness  insertional and interstitial tears of the left gluteus minimus tendon. 3. Moderate left peritrochanteric bursal fluid. 4. 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 5. Degenerative changes of the bilateral sacroiliac joints, right slightly greater than left.   Electronically Signed   By: Duanne Guess M.D.   On: 11/04/2019 08:46     Objective:  VS:  HT:    WT:   BMI:     BP:   HR: bpm  TEMP: ( )  RESP:  Physical Exam Vitals and nursing note reviewed.  Constitutional:      General: She is not in acute distress.    Appearance: Normal appearance. She is well-developed. She is not ill-appearing.  HENT:     Head: Normocephalic and atraumatic.  Eyes:     Conjunctiva/sclera: Conjunctivae normal.     Pupils: Pupils are equal, round, and reactive to light.  Cardiovascular:     Rate and Rhythm: Normal rate.     Pulses: Normal pulses.  Pulmonary:     Effort: Pulmonary effort is normal.  Musculoskeletal:     Right lower leg: No edema.     Left lower leg: No edema.     Comments: Good range of motion of both hips no pain with internal rotation she does have some mild pain on the right with external rotation. She has pain over the right greater trochanter and a bit more posterior along the gluteus medius tendon.  Skin:    General: Skin is warm and dry.     Findings: No erythema or rash.  Neurological:     General: No focal deficit present.     Mental Status: She is alert and oriented to person, place, and time.     Sensory: No sensory deficit.     Motor: No abnormal muscle tone.     Coordination: Coordination normal.     Gait: Gait normal.  Psychiatric:        Mood and Affect: Mood normal.        Behavior: Behavior normal.      Imaging: No results found.

## 2020-06-13 NOTE — Telephone Encounter (Signed)
Results faxed to both numbers listed.

## 2020-06-13 NOTE — Telephone Encounter (Signed)
Attempted to fax ZIO report to [phone] and [phone].  Fax would not go through on either number.

## 2020-08-28 ENCOUNTER — Telehealth: Payer: Self-pay

## 2020-08-28 NOTE — Telephone Encounter (Signed)
Spoke with Dr. Geoffery Lyons office who confirmed results of heart monitor were received. No further questions at this time.

## 2020-08-28 NOTE — Telephone Encounter (Signed)
-----   Message from Rollene Rotunda, MD sent at 05/14/2020  9:36 AM EDT ----- Regarding: FW: Long term holter monitor results Please call Dr. Geoffery Lyons office and see if they got this.  Thanks.   ----- Message ----- From: Ernst Bowler Sent: 05/11/2020   8:39 AM EDT To: Rollene Rotunda, MD Subject: Long term holter monitor results               Long term monitor results available for your review.  Dr. Polly Cobia office is requesting results be faxed to [phone] or [phone].  Please go to " [employer] Studies,  Assigned to me,  Reading Work List ",  to review patients monitor and sign the study.   Thank you, Burnett Harry

## 2021-05-08 ENCOUNTER — Ambulatory Visit
Admission: RE | Admit: 2021-05-08 | Discharge: 2021-05-08 | Disposition: A | Discharge status: Home / Self Care | Payer: Medicare Other | Source: Ambulatory Visit | Attending: Family Medicine | Admitting: Family Medicine

## 2021-05-08 ENCOUNTER — Other Ambulatory Visit: Payer: Self-pay

## 2021-05-08 ENCOUNTER — Other Ambulatory Visit: Payer: Self-pay | Admitting: Family Medicine

## 2021-05-08 DIAGNOSIS — R52 Pain, unspecified: Secondary | ICD-10-CM

## 2021-05-17 ENCOUNTER — Other Ambulatory Visit (HOSPITAL_COMMUNITY): Payer: Self-pay | Admitting: Family Medicine

## 2021-05-23 ENCOUNTER — Other Ambulatory Visit: Payer: Self-pay | Admitting: Family Medicine

## 2021-05-23 DIAGNOSIS — M545 Low back pain, unspecified: Secondary | ICD-10-CM

## 2021-05-25 ENCOUNTER — Ambulatory Visit
Admission: RE | Admit: 2021-05-25 | Discharge: 2021-05-25 | Disposition: A | Discharge status: Home / Self Care | Payer: Medicare Other | Source: Ambulatory Visit | Attending: Family Medicine | Admitting: Family Medicine

## 2021-05-25 DIAGNOSIS — M545 Low back pain, unspecified: Secondary | ICD-10-CM

## 2021-06-04 ENCOUNTER — Telehealth: Payer: Self-pay | Admitting: Physical Medicine and Rehabilitation

## 2021-06-04 NOTE — Telephone Encounter (Signed)
Referral on desk from Dr. Duaine Dredge. Please advise.

## 2021-06-05 NOTE — Telephone Encounter (Signed)
Scheduled for 8/2 at 1000.

## 2021-06-08 ENCOUNTER — Other Ambulatory Visit: Payer: Self-pay

## 2021-06-08 ENCOUNTER — Ambulatory Visit (HOSPITAL_COMMUNITY)
Admission: RE | Admit: 2021-06-08 | Discharge: 2021-06-08 | Disposition: A | Discharge status: Home / Self Care | Payer: Self-pay | Source: Ambulatory Visit | Attending: Family Medicine | Admitting: Family Medicine

## 2021-06-08 DIAGNOSIS — R9431 Abnormal electrocardiogram [ECG] [EKG]: Secondary | ICD-10-CM | POA: Insufficient documentation

## 2021-06-19 ENCOUNTER — Encounter: Payer: Self-pay | Admitting: Physical Medicine and Rehabilitation

## 2021-06-19 ENCOUNTER — Other Ambulatory Visit: Payer: Self-pay

## 2021-06-19 ENCOUNTER — Ambulatory Visit: Payer: Medicare Other | Admitting: Physical Medicine and Rehabilitation

## 2021-06-19 VITALS — BP 146/101 | HR 63

## 2021-06-19 DIAGNOSIS — M5416 Radiculopathy, lumbar region: Secondary | ICD-10-CM | POA: Diagnosis not present

## 2021-06-19 DIAGNOSIS — M47818 Spondylosis without myelopathy or radiculopathy, sacral and sacrococcygeal region: Secondary | ICD-10-CM

## 2021-06-19 DIAGNOSIS — S76012D Strain of muscle, fascia and tendon of left hip, subsequent encounter: Secondary | ICD-10-CM

## 2021-06-19 DIAGNOSIS — M7062 Trochanteric bursitis, left hip: Secondary | ICD-10-CM | POA: Diagnosis not present

## 2021-06-19 DIAGNOSIS — M48061 Spinal stenosis, lumbar region without neurogenic claudication: Secondary | ICD-10-CM

## 2021-06-19 NOTE — Progress Notes (Signed)
Left lateral hip pain improved. Low back pain. Sometimes has pain left posterior ankle.  Numeric Pain Rating Scale and Functional Assessment Average Pain 4   In the last MONTH (on 0-10 scale) has pain interfered with the following?  1. General activity like being  able to carry out your everyday physical activities such as walking, climbing stairs, carrying groceries, or moving a chair?  Rating(4)

## 2021-06-20 ENCOUNTER — Telehealth: Payer: Self-pay | Admitting: Physical Medicine and Rehabilitation

## 2021-06-20 NOTE — Telephone Encounter (Signed)
Patient called needing to schedule an appointment  with Dr Alvester Morin. The number to contact patient is [phone]

## 2021-06-21 ENCOUNTER — Encounter: Payer: Self-pay | Admitting: Physical Medicine and Rehabilitation

## 2021-06-21 NOTE — Telephone Encounter (Signed)
Scheduled for bilateral L3-4 and L4-5 MBB on 8/15. Is auth needed?

## 2021-06-21 NOTE — Progress Notes (Signed)
Chelsea Cline - 70 y.o. female MRN [id number]  Date of birth: Jun 15, 1951  Office Visit Note: Visit Date: 06/19/2021 PCP: Mosetta Putt, MD Referred by: Mosetta Putt, MD  Subjective: Chief Complaint  Patient presents with   Lower Back - Pain   Left Ankle - Pain   HPI: Chelsea Cline is a 70 y.o. female who comes in today At the request of Dr. Mosetta Putt for reevaluation and management of worsening chronic low back pain left hip and thigh pain with some occasional pain to the left posterior ankle.  Last time I saw the patient was in July 2021 and we completed a repeat left greater trochanteric injection with fluoroscopic guidance and she is done extremely well since that time.  MRI of the hip at the time and showed quite a bit of bursitis and a tear of the left gluteus medius tendon.  She had physical therapy continue to work on strengthening exercises and time and with the injection seems to have helped quite a bit.  She reports as of late she had increasing back pain and some left lateral hip pain but that is still improved over what it was before.  She talks about back pain worse with standing and ambulating and movement.  Back pain can be worse with turning and twisting to some degree or sitting for a long time.  She is not getting frank radicular symptoms down the leg but gets this occasional pain into the left posterior ankle that is more of an achy pain.  Not on the right.  She has had no new trauma or falls.  She still active.  She continues with home exercises for her back and hip.  She has had no focal weakness or bowel or bladder changes etc.  Dr. Duaine Dredge did obtain MRI of the lumbar spine and this is reviewed today with spine models and imaging.  This basically showed degenerative changes you would expect for her age and somewhat advanced for her age however there is no central canal stenosis or focal nerve compression.  There is some foraminal narrowing on the left at L5-S1.   Some mild lateral recess narrowing throughout.  She actually has more facet arthritis at L2-3 and L3-4 sort of in the upper part of the lower spine.  We discussed the fallacy of disc bulging being a painful condition as well as really in general degenerative disc changes.  Review of Systems  Musculoskeletal:  Positive for back pain and joint pain.  All other systems reviewed and are negative. Otherwise per HPI.  Assessment & Plan: Visit Diagnoses:    ICD-10-CM   1. Greater trochanteric bursitis, left  M70.62     2. Tear of left gluteus medius tendon, subsequent encounter  S76.012D     3. 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 Lumbar radiculopathy  M54.16     5. Foraminal stenosis of lumbar region  M48.061        Plan: Findings:  1.  Chronic recent worsening of mainly low back pain some possible referral into the left lateral hip but see below about bursitis.  She has this intermittent pain achy pain into the posterior heel more of an S1 distribution if nerve related.  We had a discussion about her spine and causes of pain etc.  She still pretty active and doing fairly well at this point.  Consideration could be given to diagnostic medial branch blocks of the  mid lumbar facet joints with a goal towards radiofrequency ablation if her back pain was bad enough.  Could look at a left L5 or S1 transforaminal injection diagnostically to see if that would help enough with the pain and would also tell us if that posterior pain was related to the spine or not.  She is actually been doing fairly well with conservative care at this point just trying to figure out what is causing the symptoms.  At this point we will just wait for any intervention needed.  2.  Doing well with her left lateral hip pain.  This is not really keeping her from doing what she would like to do.  The tear of the gluteus medius at this point is probably healed to a point where there is some scar tissue etc.  She  does not seem to need any bursa injection at this point and the last one performed was over a year ago.  Right now just watchful waiting with that.   Meds & Orders: No orders of the defined types were placed in this encounter.  No orders of the defined types were placed in this encounter.   Follow-up: No follow-ups on file.   Procedures: No procedures performed      Clinical History: MRI LUMBAR SPINE WITHOUT CONTRAST   TECHNIQUE: Multiplanar, multisequence MR imaging of the lumbar spine was performed. No intravenous contrast was administered.   COMPARISON:  Lumbar spine radiographs 05/08/2021   FINDINGS: Segmentation:  Standard.   Alignment:  Trace retrolisthesis of L2 on L3.   Vertebrae: No fracture or suspicious marrow lesion. Moderate degenerative endplate edema at C5-8, greater on the right. Mildly heterogeneous bone marrow signal diffusely with scattered small hemangiomas.   Conus medullaris and cauda equina: Conus extends to the L1-2 level. Conus and cauda equina appear normal.   Paraspinal and other soft tissues: Left renal sinus cysts.   Disc levels:   Disc desiccation and severe disc space narrowing from L2-3 to L5-S1.   T12-L1: Shallow right paracentral and subarticular disc protrusion without stenosis.   L1-2: Mild facet hypertrophy without disc herniation or stenosis.   L2-3: Circumferential disc osteophyte complex and moderate facet and ligamentum flavum hypertrophy result in mild bilateral lateral recess stenosis without significant generalized spinal stenosis or neural foraminal stenosis.   L3-4: Circumferential disc osteophyte complex and moderate facet and ligamentum flavum hypertrophy result in minimal to mild bilateral neural foraminal stenosis without spinal stenosis.   L4-5: Disc bulging, endplate spurring, and mild facet and ligamentum flavum hypertrophy result in mild left lateral recess stenosis and mild left neural foraminal stenosis  without spinal stenosis.   L5-S1: Left eccentric disc bulging, endplate spurring, and mild facet hypertrophy result in moderate left and mild right neural foraminal stenosis without spinal stenosis.   IMPRESSION: 1. 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 2. Moderate left neural foraminal stenosis at L5-S1. 3. Up to mild lateral recess and neural foraminal stenosis at other levels.     Electronically Signed   By: Sebastian Ache M.D.   On: 05/25/2021 16:09 ---------  MR OF THE LEFT HIP WITHOUT CONTRAST   TECHNIQUE: Multiplanar, multisequence MR imaging was performed. No intravenous contrast was administered.   COMPARISON: X-ray 05/04/2019   FINDINGS: Bones: No acute fracture. No dislocation. No femoral head AVN. Degenerative changes of the bilateral sacroiliac joints, right slightly greater than left. Disc height loss with discogenic endplate marrow changes of the visualized lower lumbar spine, incompletely evaluated.  Articular cartilage and labrum   Articular cartilage: Cartilage thinning and surface irregularity most pronounced at the superior aspect of the left hip joint.   Labrum: Blunted, heterogeneous appearance of the anterosuperior labrum compatible with degenerative tearing.   Joint or bursal effusion   Joint effusion: None.   Bursae: Moderate left peritrochanteric bursal fluid.   Muscles and tendons   Muscles and tendons: Full-thickness avulsion tear of the left gluteus medius tendon from the greater trochanter (series 6, image 10) with minimal retraction. A portion of the posterior most tendon fibers remain attached. Tendinosis with high-grade partial-thickness insertional and interstitial tears of the left gluteus minimus tendon. Hamstring, iliopsoas, and rectus femoris tendons are intact.   Other findings   Miscellaneous: None.   IMPRESSION: 1. Full-thickness avulsion tear of the left gluteus medius tendon from the greater  trochanter. A portion of the posterior-most tendon fibers remain attached. 2. Tendinosis with high-grade partial-thickness insertional and interstitial tears of the left gluteus minimus tendon. 3. Moderate left peritrochanteric bursal fluid. 4. 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 5. Degenerative changes of the bilateral sacroiliac joints, right slightly greater than left.     Electronically Signed By: Duanne GuessNicholas Plundo M.D. On: 11/04/2019 08:46   She reports that she has never smoked. She has never used smokeless tobacco. No results for input(s): HGBA1C, LABURIC in the last 8760 hours.  Objective:  VS:  HT:    WT:   BMI:     BP:(!) 146/101  HR:63bpm  TEMP: ( )  RESP:  Physical Exam Vitals and nursing note reviewed.  Constitutional:      General: She is not in acute distress.    Appearance: Normal appearance. She is not ill-appearing.  HENT:     Head: Normocephalic and atraumatic.     Right Ear: External ear normal.     Left Ear: External ear normal.  Eyes:     Extraocular Movements: Extraocular movements intact.  Cardiovascular:     Rate and Rhythm: Normal rate.     Pulses: Normal pulses.  Pulmonary:     Effort: Pulmonary effort is normal. No respiratory distress.  Abdominal:     General: There is no distension.     Palpations: Abdomen is soft.  Musculoskeletal:        General: Tenderness present.     Cervical back: Neck supple.     Right lower leg: No edema.     Left lower leg: No edema.     Comments: Patient has good distal strength with no pain mild pain over the greater trochanters.  No clonus or focal weakness.  She does have back pain with extension and facet loading.  No focal trigger points.  Skin:    Findings: No erythema, lesion or rash.  Neurological:     General: No focal deficit present.     Mental Status: She is alert and oriented to person, place, and time.     Sensory: No sensory deficit.     Motor:  No weakness or abnormal muscle tone.     Coordination: Coordination normal.  Psychiatric:        Mood and Affect: Mood normal.        Behavior: Behavior normal.    Ortho Exam  Imaging: No results found.  Past Medical/Family/Surgical/Social History: Medications & Allergies reviewed per EMR, new medications updated. 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   Elevated alkaline phosphatase level 2006   H/O bone density study 12/11/2011   Osteopenia   H/O mammogram 12/13/2014   Hyperlipidemia 2013   Insomnia    Several years   Ocular migraine 2004   Rosacea 2014   Family History  Problem Relation Age of Onset   Stroke Mother    Cancer - Other Mother        Kidney   Diabetes Father        Died in his sleep age 2 of unkonwn cause.    Hypertension Father    Past Surgical History:  Procedure Laterality Date   CATARACT EXTRACTION Left 2006   Lens Implant   COLONOSCOPY  06/12/2005   Showed minimal diverticulosis and a Hyperplastic polyp.   EYE MUSCLE SURGERY Left 2006   for repair of a macular pucker   KNEE SURGERY Left 1996   for Chondromalacia   Social History   Occupational History   Not on file  Tobacco Use   Smoking status: Never   Smokeless tobacco: Never  Vaping Use   Vaping Use: Never used  Substance and Sexual Activity   Alcohol use: Yes    Alcohol/week: 5.0 standard drinks    Types: 5 Glasses of wine per week   Drug use: No   Sexual activity: Not on file

## 2021-07-02 ENCOUNTER — Other Ambulatory Visit: Payer: Self-pay

## 2021-07-02 ENCOUNTER — Ambulatory Visit: Payer: Self-pay

## 2021-07-02 ENCOUNTER — Encounter: Payer: Self-pay | Admitting: Physical Medicine and Rehabilitation

## 2021-07-02 ENCOUNTER — Ambulatory Visit (INDEPENDENT_AMBULATORY_CARE_PROVIDER_SITE_OTHER): Payer: Medicare Other | Admitting: Physical Medicine and Rehabilitation

## 2021-07-02 VITALS — BP 142/91 | HR 57

## 2021-07-02 DIAGNOSIS — M47816 Spondylosis without myelopathy or radiculopathy, lumbar region: Secondary | ICD-10-CM | POA: Diagnosis not present

## 2021-07-02 MED ORDER — BUPIVACAINE HCL 0.5 % IJ SOLN
3.0000 mL | Freq: Once | INTRAMUSCULAR | Status: AC
  Administered 2021-07-02: 3 mL

## 2021-07-02 NOTE — Patient Instructions (Signed)

## 2021-07-02 NOTE — Progress Notes (Signed)
Pt state lower back pain that travels down her left leg to her heel. Pt state bending and sitting foe a long time makes the pain worse. Pt state she take over the counter pain meds and a hot shower.  Numeric Pain Rating Scale and Functional Assessment Average Pain 4   In the last MONTH (on 0-10 scale) has pain interfered with the following?  1. General activity like being  able to carry out your everyday physical activities such as walking, climbing stairs, carrying groceries, or moving a chair?  Rating(8)   +Driver, -BT, -Dye Allergies.

## 2021-07-05 ENCOUNTER — Telehealth: Payer: Self-pay | Admitting: Physical Medicine and Rehabilitation

## 2021-07-05 NOTE — Telephone Encounter (Signed)
Patient called. She would like an appointment with Dr. Alvester Morin. Her call back number is [phone]

## 2021-07-11 ENCOUNTER — Telehealth: Payer: Self-pay | Admitting: Physical Medicine and Rehabilitation

## 2021-07-11 NOTE — Telephone Encounter (Signed)
Patient returned call asked for a call back. [phone]

## 2021-07-11 NOTE — Telephone Encounter (Signed)
See previous message

## 2021-07-11 NOTE — Telephone Encounter (Signed)
Rescheduled

## 2021-07-11 NOTE — Telephone Encounter (Signed)
Left message #1 to reschedule appointment. 

## 2021-07-11 NOTE — Telephone Encounter (Signed)
Pt calling needing to change her appt to another date. The best call back number is [phone].

## 2021-07-17 NOTE — Procedures (Signed)
Lumbar Diagnostic Facet Joint Nerve Block with Fluoroscopic Guidance   Patient: Chelsea Cline      Date of Birth: [birth year] MRN: [id number] PCP: Mosetta Putt, MD      Visit Date: 07/02/2021   Universal Protocol:    Date/Time: 08/30/225:58 AM  Consent Given By: the patient  Position: PRONE  Additional Comments: Vital signs were monitored before and after the procedure. Patient was prepped and draped in the usual sterile fashion. The correct patient, procedure, and site was verified.   Injection Procedure Details:   Procedure diagnoses:  1. Spondylosis without myelopathy or radiculopathy, lumbar region      Meds Administered:  Meds ordered this encounter  Medications   bupivacaine (MARCAINE) 0.5 % (with pres) injection 3 mL     Laterality: Bilateral  Location/Site: L3-L4, L2 and L3 medial branches and L4-L5, L3 and L4 medial branches  Needle: 5.0 in., 25 ga.  Short bevel or Quincke spinal needle  Needle Placement: Oblique pedical  Findings:   -Comments: There was excellent flow of contrast along the articular pillars without intravascular flow.  Procedure Details: The fluoroscope beam is vertically oriented in AP and then obliqued 15 to 20 degrees to the ipsilateral side of the desired nerve to achieve the "Scotty dog" appearance.  The skin over the target area of the junction of the superior articulating process and the transverse process (sacral ala if blocking the L5 dorsal rami) was locally anesthetized with a 1 ml volume of 1% Lidocaine without Epinephrine.  The spinal needle was inserted and advanced in a trajectory view down to the target.   After contact with periosteum and negative aspirate for blood and CSF, correct placement without intravascular or epidural spread was confirmed by injecting 0.5 ml. of Isovue-250.  A spot radiograph was obtained of this image.    Next, a 0.5 ml. volume of the injectate described above was injected. The needle was then  redirected to the other facet joint nerves mentioned above if needed.  Prior to the procedure, the patient was given a Pain Diary which was completed for baseline measurements.  After the procedure, the patient rated their pain every 30 minutes and will continue rating at this frequency for a total of 5 hours.  The patient has been asked to complete the Diary and return to [place] by mail, fax or hand delivered as soon as possible.   Additional Comments:  The patient tolerated the procedure well Dressing: 2 x 2 sterile gauze and Band-Aid    Post-procedure details: Patient was observed during the procedure. Post-procedure instructions were reviewed.  Patient left the clinic in stable condition.

## 2021-07-17 NOTE — Progress Notes (Signed)
GOLDA ZAVALZA - 70 y.o. female MRN [id number]  Date of birth: 04-Aug-1951  Office Visit Note: Visit Date: 07/02/2021 PCP: Mosetta Putt, MD Referred by: Mosetta Putt, MD  Subjective: Chief Complaint  Patient presents with   Lower Back - Pain   Left Leg - Pain   Left Heel - Pain   HPI:  MARLYNN HINCKLEY is a 70 y.o. female who comes in today  for planned Bilateral  L3-4 and L4-L5 Lumbar facet/medial branch block with fluoroscopic guidance.  The patient has failed conservative care including home exercise, medications, time and activity modification.  This injection will be diagnostic and hopefully therapeutic.  Please see requesting physician notes for further details and justification.  Exam has shown concordant pain with facet joint loading.   ROS Otherwise per HPI.  Assessment & Plan: Visit Diagnoses:    ICD-10-CM   1. Spondylosis without myelopathy or radiculopathy, lumbar region  M47.816 XR C-ARM NO REPORT    Facet Injection    bupivacaine (MARCAINE) 0.5 % (with pres) injection 3 mL      Plan: No additional findings.   Meds & Orders:  Meds ordered this encounter  Medications   bupivacaine (MARCAINE) 0.5 % (with pres) injection 3 mL    Orders Placed This Encounter  Procedures   Facet Injection   XR C-ARM NO REPORT    Follow-up: Return for Review Pain Diary.   Procedures: No procedures performed  Lumbar Diagnostic Facet Joint Nerve Block with Fluoroscopic Guidance   Patient: MARLEIGH KAYLOR      Date of Birth: Sep 30, 1951 MRN: [id number] PCP: Mosetta Putt, MD      Visit Date: 07/02/2021   Universal Protocol:    Date/Time: 08/30/225:58 AM  Consent Given By: the patient  Position: PRONE  Additional Comments: Vital signs were monitored before and after the procedure. Patient was prepped and draped in the usual sterile fashion. The correct patient, procedure, and site was verified.   Injection Procedure Details:   Procedure diagnoses:  1.  Spondylosis without myelopathy or radiculopathy, lumbar region      Meds Administered:  Meds ordered this encounter  Medications   bupivacaine (MARCAINE) 0.5 % (with pres) injection 3 mL     Laterality: Bilateral  Location/Site: L3-L4, L2 and L3 medial branches and L4-L5, L3 and L4 medial branches  Needle: 5.0 in., 25 ga.  Short bevel or Quincke spinal needle  Needle Placement: Oblique pedical  Findings:   -Comments: There was excellent flow of contrast along the articular pillars without intravascular flow.  Procedure Details: The fluoroscope beam is vertically oriented in AP and then obliqued 15 to 20 degrees to the ipsilateral side of the desired nerve to achieve the "Scotty dog" appearance.  The skin over the target area of the junction of the superior articulating process and the transverse process (sacral ala if blocking the L5 dorsal rami) was locally anesthetized with a 1 ml volume of 1% Lidocaine without Epinephrine.  The spinal needle was inserted and advanced in a trajectory view down to the target.   After contact with periosteum and negative aspirate for blood and CSF, correct placement without intravascular or epidural spread was confirmed by injecting 0.5 ml. of Isovue-250.  A spot radiograph was obtained of this image.    Next, a 0.5 ml. volume of the injectate described above was injected. The needle was then redirected to the other facet joint nerves mentioned above if needed.  Prior to the procedure, the patient was  given a Pain Diary which was completed for baseline measurements.  After the procedure, the patient rated their pain every 30 minutes and will continue rating at this frequency for a total of 5 hours.  The patient has been asked to complete the Diary and return to [place] by mail, fax or hand delivered as soon as possible.   Additional Comments:  The patient tolerated the procedure well Dressing: 2 x 2 sterile gauze and Band-Aid    Post-procedure  details: Patient was observed during the procedure. Post-procedure instructions were reviewed.  Patient left the clinic in stable condition.    Clinical History: MRI LUMBAR SPINE WITHOUT CONTRAST   TECHNIQUE: Multiplanar, multisequence MR imaging of the lumbar spine was performed. No intravenous contrast was administered.   COMPARISON:  Lumbar spine radiographs 05/08/2021   FINDINGS: Segmentation:  Standard.   Alignment:  Trace retrolisthesis of L2 on L3.   Vertebrae: No fracture or suspicious marrow lesion. Moderate degenerative endplate edema at M6-2, greater on the right. Mildly heterogeneous bone marrow signal diffusely with scattered small hemangiomas.   Conus medullaris and cauda equina: Conus extends to the L1-2 level. Conus and cauda equina appear normal.   Paraspinal and other soft tissues: Left renal sinus cysts.   Disc levels:   Disc desiccation and severe disc space narrowing from L2-3 to L5-S1.   T12-L1: Shallow right paracentral and subarticular disc protrusion without stenosis.   L1-2: Mild facet hypertrophy without disc herniation or stenosis.   L2-3: Circumferential disc osteophyte complex and moderate facet and ligamentum flavum hypertrophy result in mild bilateral lateral recess stenosis without significant generalized spinal stenosis or neural foraminal stenosis.   L3-4: Circumferential disc osteophyte complex and moderate facet and ligamentum flavum hypertrophy result in minimal to mild bilateral neural foraminal stenosis without spinal stenosis.   L4-5: Disc bulging, endplate spurring, and mild facet and ligamentum flavum hypertrophy result in mild left lateral recess stenosis and mild left neural foraminal stenosis without spinal stenosis.   L5-S1: Left eccentric disc bulging, endplate spurring, and mild facet hypertrophy result in moderate left and mild right neural foraminal stenosis without spinal stenosis.   IMPRESSION: 1. 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 2. Moderate left neural foraminal stenosis at L5-S1. 3. Up to mild lateral recess and neural foraminal stenosis at other levels.     Electronically Signed   By: Sebastian Ache M.D.   On: 05/25/2021 16:09 ---------  MR OF THE LEFT HIP WITHOUT CONTRAST   TECHNIQUE: Multiplanar, multisequence MR imaging was performed. No intravenous contrast was administered.   COMPARISON: X-ray 05/04/2019   FINDINGS: Bones: No acute fracture. No dislocation. No femoral head AVN. Degenerative changes of the bilateral sacroiliac joints, right slightly greater than left. Disc height loss with discogenic endplate marrow changes of the visualized lower lumbar spine, incompletely evaluated.   Articular cartilage and labrum   Articular cartilage: Cartilage thinning and surface irregularity most pronounced at the superior aspect of the left hip joint.   Labrum: Blunted, heterogeneous appearance of the anterosuperior labrum compatible with degenerative tearing.   Joint or bursal effusion   Joint effusion: None.   Bursae: Moderate left peritrochanteric bursal fluid.   Muscles and tendons   Muscles and tendons: Full-thickness avulsion tear of the left gluteus medius tendon from the greater trochanter (series 6, image 10) with minimal retraction. A portion of the posterior most tendon fibers remain attached. Tendinosis with high-grade partial-thickness insertional and interstitial tears of the left gluteus minimus tendon. Hamstring, iliopsoas,  and rectus femoris tendons are intact.   Other findings   Miscellaneous: None.   IMPRESSION: 1. Full-thickness avulsion tear of the left gluteus medius tendon from the greater trochanter. A portion of the posterior-most tendon fibers remain attached. 2. Tendinosis with high-grade partial-thickness insertional and interstitial tears of the left gluteus minimus tendon. 3. Moderate left peritrochanteric  bursal fluid. 4. 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 5. Degenerative changes of the bilateral sacroiliac joints, right slightly greater than left.     Electronically Signed By: Duanne Guess M.D. On: 11/04/2019 08:46     Objective:  VS:  HT:    WT:   BMI:     BP:(!) 142/91  HR:(!) 57bpm  TEMP: ( )  RESP:  Physical Exam Vitals and nursing note reviewed.  Constitutional:      General: She is not in acute distress.    Appearance: Normal appearance. She is not ill-appearing.  HENT:     Head: Normocephalic and atraumatic.     Right Ear: External ear normal.     Left Ear: External ear normal.  Eyes:     Extraocular Movements: Extraocular movements intact.  Cardiovascular:     Rate and Rhythm: Normal rate.     Pulses: Normal pulses.  Pulmonary:     Effort: Pulmonary effort is normal. No respiratory distress.  Abdominal:     General: There is no distension.     Palpations: Abdomen is soft.  Musculoskeletal:        General: Tenderness present.     Cervical back: Neck supple.     Right lower leg: No edema.     Left lower leg: No edema.     Comments: Patient has good distal strength with no pain over the greater trochanters.  No clonus or focal weakness. Patient somewhat slow to rise from a seated position to full extension.  There is concordant low back pain with facet loading and lumbar spine extension rotation.  There are no definitive trigger points but the patient is somewhat tender across the lower back and PSIS.  There is no pain with hip rotation.   Skin:    Findings: No erythema, lesion or rash.  Neurological:     General: No focal deficit present.     Mental Status: She is alert and oriented to person, place, and time.     Sensory: No sensory deficit.     Motor: No weakness or abnormal muscle tone.     Coordination: Coordination normal.  Psychiatric:        Mood and Affect: Mood normal.        Behavior: Behavior  normal.     Imaging: No results found.

## 2021-07-19 ENCOUNTER — Other Ambulatory Visit: Payer: Self-pay

## 2021-07-19 ENCOUNTER — Encounter: Payer: Self-pay | Admitting: Physical Medicine and Rehabilitation

## 2021-07-19 ENCOUNTER — Ambulatory Visit (INDEPENDENT_AMBULATORY_CARE_PROVIDER_SITE_OTHER): Payer: Medicare Other | Admitting: Physical Medicine and Rehabilitation

## 2021-07-19 ENCOUNTER — Ambulatory Visit: Payer: Self-pay

## 2021-07-19 VITALS — BP 136/83 | HR 75

## 2021-07-19 DIAGNOSIS — M47816 Spondylosis without myelopathy or radiculopathy, lumbar region: Secondary | ICD-10-CM | POA: Diagnosis not present

## 2021-07-19 MED ORDER — BUPIVACAINE HCL 0.5 % IJ SOLN
3.0000 mL | Freq: Once | INTRAMUSCULAR | Status: AC
  Administered 2021-07-19: 3 mL

## 2021-07-19 NOTE — Patient Instructions (Signed)

## 2021-07-19 NOTE — Progress Notes (Signed)
Pt state lower back pain. Pt state bending ad getting up from sitting makes the pain worse. Pt state PT and heating and ice helps ease help pain. Pt has hx of inj 07/02/21 pt state it helped.  Numeric Pain Rating Scale and Functional Assessment Average Pain 3   In the last MONTH (on 0-10 scale) has pain interfered with the following?  1. General activity like being  able to carry out your everyday physical activities such as walking, climbing stairs, carrying groceries, or moving a chair?  Rating(7)   +Driver, -BT, -Dye Allergies.

## 2021-07-20 NOTE — Procedures (Signed)
Lumbar Diagnostic Facet Joint Nerve Block with Fluoroscopic Guidance   Patient: Chelsea Cline      Date of Birth: October 13, 1951 MRN: [id number] PCP: Mosetta Putt, MD      Visit Date: 07/19/2021   Universal Protocol:    Date/Time: 09/02/225:52 AM  Consent Given By: the patient  Position: PRONE  Additional Comments: Vital signs were monitored before and after the procedure. Patient was prepped and draped in the usual sterile fashion. The correct patient, procedure, and site was verified.   Injection Procedure Details:   Procedure diagnoses:  1. Spondylosis without myelopathy or radiculopathy, lumbar region      Meds Administered:  Meds ordered this encounter  Medications   bupivacaine (MARCAINE) 0.5 % (with pres) injection 3 mL     Laterality: Bilateral  Location/Site: L3-L4, L2 and L3 medial branches and L4-L5, L3 and L4 medial branches  Needle: 5.0 in., 25 ga.  Short bevel or Quincke spinal needle  Needle Placement: Oblique pedical  Findings:   -Comments: There was excellent flow of contrast along the articular pillars without intravascular flow.  Procedure Details: The fluoroscope beam is vertically oriented in AP and then obliqued 15 to 20 degrees to the ipsilateral side of the desired nerve to achieve the "Scotty dog" appearance.  The skin over the target area of the junction of the superior articulating process and the transverse process (sacral ala if blocking the L5 dorsal rami) was locally anesthetized with a 1 ml volume of 1% Lidocaine without Epinephrine.  The spinal needle was inserted and advanced in a trajectory view down to the target.   After contact with periosteum and negative aspirate for blood and CSF, correct placement without intravascular or epidural spread was confirmed by injecting 0.5 ml. of Isovue-250.  A spot radiograph was obtained of this image.    Next, a 0.5 ml. volume of the injectate described above was injected. The needle was then  redirected to the other facet joint nerves mentioned above if needed.  Prior to the procedure, the patient was given a Pain Diary which was completed for baseline measurements.  After the procedure, the patient rated their pain every 30 minutes and will continue rating at this frequency for a total of 5 hours.  The patient has been asked to complete the Diary and return to [place] by mail, fax or hand delivered as soon as possible.   Additional Comments:  The patient tolerated the procedure well Dressing: 2 x 2 sterile gauze and Band-Aid    Post-procedure details: Patient was observed during the procedure. Post-procedure instructions were reviewed.  Patient left the clinic in stable condition.

## 2021-07-20 NOTE — Progress Notes (Signed)
Chelsea Cline - 70 y.o. female MRN [id number]  Date of birth: 1951-05-01  Office Visit Note: Visit Date: 07/19/2021 PCP: Mosetta Putt, MD Referred by: Mosetta Putt, MD  Subjective: Chief Complaint  Patient presents with   Lower Back - Pain   HPI:  Chelsea Cline is a 70 y.o. female who comes in today for planned repeat Bilateral L3-L4 and L4-L5 Lumbar facet/medial branch block with fluoroscopic guidance.  The patient has failed conservative care including home exercise, medications, time and activity modification.  This injection will be diagnostic and hopefully therapeutic.  Please see requesting physician notes for further details and justification.  Exam shows concordant low back pain with facet joint loading and extension. Patient received more than 80% pain relief from prior injection. This would be the second block in a diagnostic double block paradigm.     Referring:Dr. Mosetta Putt  ROS Otherwise per HPI.  Assessment & Plan: Visit Diagnoses:    ICD-10-CM   1. Spondylosis without myelopathy or radiculopathy, lumbar region  M47.816 XR C-ARM NO REPORT    Facet Injection    bupivacaine (MARCAINE) 0.5 % (with pres) injection 3 mL      Plan: No additional findings.   Meds & Orders:  Meds ordered this encounter  Medications   bupivacaine (MARCAINE) 0.5 % (with pres) injection 3 mL    Orders Placed This Encounter  Procedures   Facet Injection   XR C-ARM NO REPORT    Follow-up: Return for Review Pain Diary.   Procedures: No procedures performed  Lumbar Diagnostic Facet Joint Nerve Block with Fluoroscopic Guidance   Patient: Chelsea Cline      Date of Birth: [birth year] MRN: [id number] PCP: Mosetta Putt, MD      Visit Date: 07/19/2021   Universal Protocol:    Date/Time: 09/02/225:52 AM  Consent Given By: the patient  Position: PRONE  Additional Comments: Vital signs were monitored before and after the procedure. Patient was prepped and draped  in the usual sterile fashion. The correct patient, procedure, and site was verified.   Injection Procedure Details:   Procedure diagnoses:  1. Spondylosis without myelopathy or radiculopathy, lumbar region      Meds Administered:  Meds ordered this encounter  Medications   bupivacaine (MARCAINE) 0.5 % (with pres) injection 3 mL     Laterality: Bilateral  Location/Site: L3-L4, L2 and L3 medial branches and L4-L5, L3 and L4 medial branches  Needle: 5.0 in., 25 ga.  Short bevel or Quincke spinal needle  Needle Placement: Oblique pedical  Findings:   -Comments: There was excellent flow of contrast along the articular pillars without intravascular flow.  Procedure Details: The fluoroscope beam is vertically oriented in AP and then obliqued 15 to 20 degrees to the ipsilateral side of the desired nerve to achieve the "Scotty dog" appearance.  The skin over the target area of the junction of the superior articulating process and the transverse process (sacral ala if blocking the L5 dorsal rami) was locally anesthetized with a 1 ml volume of 1% Lidocaine without Epinephrine.  The spinal needle was inserted and advanced in a trajectory view down to the target.   After contact with periosteum and negative aspirate for blood and CSF, correct placement without intravascular or epidural spread was confirmed by injecting 0.5 ml. of Isovue-250.  A spot radiograph was obtained of this image.    Next, a 0.5 ml. volume of the injectate described above was injected. The needle was then redirected  to the other facet joint nerves mentioned above if needed.  Prior to the procedure, the patient was given a Pain Diary which was completed for baseline measurements.  After the procedure, the patient rated their pain every 30 minutes and will continue rating at this frequency for a total of 5 hours.  The patient has been asked to complete the Diary and return to [place] by mail, fax or hand delivered as soon as  possible.   Additional Comments:  The patient tolerated the procedure well Dressing: 2 x 2 sterile gauze and Band-Aid    Post-procedure details: Patient was observed during the procedure. Post-procedure instructions were reviewed.  Patient left the clinic in stable condition.    Clinical History: MRI LUMBAR SPINE WITHOUT CONTRAST   TECHNIQUE: Multiplanar, multisequence MR imaging of the lumbar spine was performed. No intravenous contrast was administered.   COMPARISON:  Lumbar spine radiographs 05/08/2021   FINDINGS: Segmentation:  Standard.   Alignment:  Trace retrolisthesis of L2 on L3.   Vertebrae: No fracture or suspicious marrow lesion. Moderate degenerative endplate edema at J2-8, greater on the right. Mildly heterogeneous bone marrow signal diffusely with scattered small hemangiomas.   Conus medullaris and cauda equina: Conus extends to the L1-2 level. Conus and cauda equina appear normal.   Paraspinal and other soft tissues: Left renal sinus cysts.   Disc levels:   Disc desiccation and severe disc space narrowing from L2-3 to L5-S1.   T12-L1: Shallow right paracentral and subarticular disc protrusion without stenosis.   L1-2: Mild facet hypertrophy without disc herniation or stenosis.   L2-3: Circumferential disc osteophyte complex and moderate facet and ligamentum flavum hypertrophy result in mild bilateral lateral recess stenosis without significant generalized spinal stenosis or neural foraminal stenosis.   L3-4: Circumferential disc osteophyte complex and moderate facet and ligamentum flavum hypertrophy result in minimal to mild bilateral neural foraminal stenosis without spinal stenosis.   L4-5: Disc bulging, endplate spurring, and mild facet and ligamentum flavum hypertrophy result in mild left lateral recess stenosis and mild left neural foraminal stenosis without spinal stenosis.   L5-S1: Left eccentric disc bulging, endplate spurring, and  mild facet hypertrophy result in moderate left and mild right neural foraminal stenosis without spinal stenosis.   IMPRESSION: 1. 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 2. Moderate left neural foraminal stenosis at L5-S1. 3. Up to mild lateral recess and neural foraminal stenosis at other levels.     Electronically Signed   By: Sebastian Ache M.D.   On: 05/25/2021 16:09 ---------  MR OF THE LEFT HIP WITHOUT CONTRAST   TECHNIQUE: Multiplanar, multisequence MR imaging was performed. No intravenous contrast was administered.   COMPARISON: X-ray 05/04/2019   FINDINGS: Bones: No acute fracture. No dislocation. No femoral head AVN. Degenerative changes of the bilateral sacroiliac joints, right slightly greater than left. Disc height loss with discogenic endplate marrow changes of the visualized lower lumbar spine, incompletely evaluated.   Articular cartilage and labrum   Articular cartilage: Cartilage thinning and surface irregularity most pronounced at the superior aspect of the left hip joint.   Labrum: Blunted, heterogeneous appearance of the anterosuperior labrum compatible with degenerative tearing.   Joint or bursal effusion   Joint effusion: None.   Bursae: Moderate left peritrochanteric bursal fluid.   Muscles and tendons   Muscles and tendons: Full-thickness avulsion tear of the left gluteus medius tendon from the greater trochanter (series 6, image 10) with minimal retraction. A portion of the posterior most tendon fibers  remain attached. Tendinosis with high-grade partial-thickness insertional and interstitial tears of the left gluteus minimus tendon. Hamstring, iliopsoas, and rectus femoris tendons are intact.   Other findings   Miscellaneous: None.   IMPRESSION: 1. Full-thickness avulsion tear of the left gluteus medius tendon from the greater trochanter. A portion of the posterior-most tendon fibers remain attached. 2.  Tendinosis with high-grade partial-thickness insertional and interstitial tears of the left gluteus minimus tendon. 3. Moderate left peritrochanteric bursal fluid. 4. 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 5. Degenerative changes of the bilateral sacroiliac joints, right slightly greater than left.     Electronically Signed By: Duanne Guess M.D. On: 11/04/2019 08:46     Objective:  VS:  HT:    WT:   BMI:     BP:136/83  HR:75bpm  TEMP: ( )  RESP:  Physical Exam Vitals and nursing note reviewed.  Constitutional:      General: She is not in acute distress.    Appearance: Normal appearance. She is not ill-appearing.  HENT:     Head: Normocephalic and atraumatic.     Right Ear: External ear normal.     Left Ear: External ear normal.  Eyes:     Extraocular Movements: Extraocular movements intact.  Cardiovascular:     Rate and Rhythm: Normal rate.     Pulses: Normal pulses.  Pulmonary:     Effort: Pulmonary effort is normal. No respiratory distress.  Abdominal:     General: There is no distension.     Palpations: Abdomen is soft.  Musculoskeletal:        General: Tenderness present.     Cervical back: Neck supple.     Right lower leg: No edema.     Left lower leg: No edema.     Comments: Patient has good distal strength with no pain over the greater trochanters.  No clonus or focal weakness. Patient somewhat slow to rise from a seated position to full extension.  There is concordant low back pain with facet loading and lumbar spine extension rotation.  There are no definitive trigger points but the patient is somewhat tender across the lower back and PSIS.  There is no pain with hip rotation.   Skin:    Findings: No erythema, lesion or rash.  Neurological:     General: No focal deficit present.     Mental Status: She is alert and oriented to person, place, and time.     Sensory: No sensory deficit.     Motor: No weakness  or abnormal muscle tone.     Coordination: Coordination normal.  Psychiatric:        Mood and Affect: Mood normal.        Behavior: Behavior normal.     Imaging: XR C-ARM NO REPORT  Result Date: 07/19/2021 Please see Notes tab for imaging impression.

## 2021-07-24 ENCOUNTER — Ambulatory Visit: Payer: Medicare Other | Admitting: Physical Medicine and Rehabilitation

## 2021-07-26 ENCOUNTER — Telehealth: Payer: Self-pay | Admitting: Physical Medicine and Rehabilitation

## 2021-07-26 NOTE — Telephone Encounter (Signed)
Patient called. She would like an appointment with Dr. Newton. Her call back number is [phone] 

## 2021-07-27 NOTE — Telephone Encounter (Signed)
Patient states that she mailed her pain diary. I advised that we will submit to insurance for auth once we receive that and will call her to schedule.

## 2021-08-07 ENCOUNTER — Telehealth: Payer: Self-pay | Admitting: Physical Medicine and Rehabilitation

## 2021-08-07 NOTE — Telephone Encounter (Signed)
Pt called and needs to schedule an abrasion with Dr.Newton.   CB [phone]

## 2021-08-09 NOTE — Telephone Encounter (Signed)
Any idea where the auth for her RFA stands?

## 2021-08-22 ENCOUNTER — Ambulatory Visit: Payer: Medicare Other | Admitting: Physical Medicine and Rehabilitation

## 2021-08-22 ENCOUNTER — Encounter: Payer: Self-pay | Admitting: Physical Medicine and Rehabilitation

## 2021-08-22 ENCOUNTER — Ambulatory Visit: Payer: Self-pay

## 2021-08-22 ENCOUNTER — Other Ambulatory Visit: Payer: Self-pay

## 2021-08-22 VITALS — BP 130/95 | HR 68

## 2021-08-22 DIAGNOSIS — M47816 Spondylosis without myelopathy or radiculopathy, lumbar region: Secondary | ICD-10-CM

## 2021-08-22 MED ORDER — BETAMETHASONE SOD PHOS & ACET 6 (3-3) MG/ML IJ SUSP
12.0000 mg | Freq: Once | INTRAMUSCULAR | Status: AC
  Administered 2021-08-22: 12 mg

## 2021-08-22 NOTE — Progress Notes (Signed)
Pt state lower back pain. Pt state bending ad getting up from sitting makes the pain worse. Pt state PT and heating and ice helps ease help pain. Pt has hx of inj on 07/19/21 pt state it helped.  Numeric Pain Rating Scale and Functional Assessment Average Pain 2   In the last MONTH (on 0-10 scale) has pain interfered with the following?  1. General activity like being  able to carry out your everyday physical activities such as walking, climbing stairs, carrying groceries, or moving a chair?  Rating(7)   +Driver, -BT, -Dye Allergies.

## 2021-08-22 NOTE — Patient Instructions (Signed)

## 2021-08-27 NOTE — Progress Notes (Signed)
Chelsea Cline - 70 y.o. female MRN [id number]  Date of birth: 1951/07/29  Office Visit Note: Visit Date: 08/22/2021 PCP: Mosetta Putt, MD Referred by: Mosetta Putt, MD  Subjective: Chief Complaint  Patient presents with   Lower Back - Pain   HPI:  Chelsea Cline is a 70 y.o. female who comes in todayfor planned radiofrequency ablation of the Right L3-4 and L4-L5 Lumbar facet joints. This would be ablation of the corresponding medial branches and/or dorsal rami.  Patient has had double diagnostic blocks with more than 50% relief.  These are documented on pain diary.  They have had chronic back pain for quite some time, more than 3 months, which has been an ongoing situation with recalcitrant axial back pain.  They have no radicular pain.  Their axial pain is worse with standing and ambulating and on exam today with facet loading.  They have had physical therapy as well as home exercise program.  The imaging noted in the chart below indicated facet pathology. Accordingly they meet all the criteria and qualification for for radiofrequency ablation and we are going to complete this today hopefully for more longer term relief as part of comprehensive management program.   ROS Otherwise per HPI.  Assessment & Plan: Visit Diagnoses:    ICD-10-CM   1. Spondylosis without myelopathy or radiculopathy, lumbar region  M47.816 XR C-ARM NO REPORT    Radiofrequency,Lumbar    betamethasone acetate-betamethasone sodium phosphate (CELESTONE) injection 12 mg      Plan: No additional findings.   Meds & Orders:  Meds ordered this encounter  Medications   betamethasone acetate-betamethasone sodium phosphate (CELESTONE) injection 12 mg    Orders Placed This Encounter  Procedures   Radiofrequency,Lumbar   XR C-ARM NO REPORT    Follow-up: Return if symptoms worsen or fail to improve.   Procedures: No procedures performed  Lumbar Facet Joint Nerve Denervation  Patient: Chelsea Cline      Date of Birth: August 02, 1951 MRN: [id number] PCP: Mosetta Putt, MD      Visit Date: 08/22/2021   Universal Protocol:    Date/Time: 10/10/226:15 AM  Consent Given By: the patient  Position: PRONE  Additional Comments: Vital signs were monitored before and after the procedure. Patient was prepped and draped in the usual sterile fashion. The correct patient, procedure, and site was verified.   Injection Procedure Details:   Procedure diagnoses:  1. Spondylosis without myelopathy or radiculopathy, lumbar region      Meds Administered:  Meds ordered this encounter  Medications   betamethasone acetate-betamethasone sodium phosphate (CELESTONE) injection 12 mg     Laterality: Right  Location/Site:  L3-L4, L2 and L3 medial branches and L4-L5, L3 and L4 medial branches  Needle: 18 ga.,  53mm active tip, RF Cannula  Needle Placement: Along juncture of superior articular process and transverse pocess  Findings:  -Comments:  Procedure Details: For each desired target nerve, the corresponding transverse process (sacral ala for the L5 dorsal rami) was identified and the fluoroscope was positioned to square off the endplates of the corresponding vertebral body to achieve a true AP midline view.  The beam was then obliqued 15 to 20 degrees and caudally tilted 15 to 20 degrees to line up a trajectory along the target nerves. The skin over the target of the junction of superior articulating process and transverse process (sacral ala for the L5 dorsal rami) was infiltrated with 28ml of 1% Lidocaine without Epinephrine.  The 18  gauge 44mm active tip outer cannula was advanced in trajectory view to the target.  This procedure was repeated for each target nerve.  Then, for all levels, the outer cannula placement was fine-tuned and the position was then confirmed with bi-planar imaging.    Test stimulation was done both at sensory and motor levels to ensure there was no  radicular stimulation. The target tissues were then infiltrated with 1 ml of 1% Lidocaine without Epinephrine. Subsequently, a percutaneous neurotomy was carried out for 90 seconds at 80 degrees Celsius.  After the completion of the lesion, 1 ml of injectate was delivered. It was then repeated for each facet joint nerve mentioned above. Appropriate radiographs were obtained to verify the probe placement during the neurotomy.   Additional Comments:  No complications occurred Dressing: 2 x 2 sterile gauze and Band-Aid    Post-procedure details: Patient was observed during the procedure. Post-procedure instructions were reviewed.  Patient left the clinic in stable condition.       Clinical History: MRI LUMBAR SPINE WITHOUT CONTRAST   TECHNIQUE: Multiplanar, multisequence MR imaging of the lumbar spine was performed. No intravenous contrast was administered.   COMPARISON:  Lumbar spine radiographs 05/08/2021   FINDINGS: Segmentation:  Standard.   Alignment:  Trace retrolisthesis of L2 on L3.   Vertebrae: No fracture or suspicious marrow lesion. Moderate degenerative endplate edema at U2-7, greater on the right. Mildly heterogeneous bone marrow signal diffusely with scattered small hemangiomas.   Conus medullaris and cauda equina: Conus extends to the L1-2 level. Conus and cauda equina appear normal.   Paraspinal and other soft tissues: Left renal sinus cysts.   Disc levels:   Disc desiccation and severe disc space narrowing from L2-3 to L5-S1.   T12-L1: Shallow right paracentral and subarticular disc protrusion without stenosis.   L1-2: Mild facet hypertrophy without disc herniation or stenosis.   L2-3: Circumferential disc osteophyte complex and moderate facet and ligamentum flavum hypertrophy result in mild bilateral lateral recess stenosis without significant generalized spinal stenosis or neural foraminal stenosis.   L3-4: Circumferential disc osteophyte complex  and moderate facet and ligamentum flavum hypertrophy result in minimal to mild bilateral neural foraminal stenosis without spinal stenosis.   L4-5: Disc bulging, endplate spurring, and mild facet and ligamentum flavum hypertrophy result in mild left lateral recess stenosis and mild left neural foraminal stenosis without spinal stenosis.   L5-S1: Left eccentric disc bulging, endplate spurring, and mild facet hypertrophy result in moderate left and mild right neural foraminal stenosis without spinal stenosis.   IMPRESSION: 1. 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 2. Moderate left neural foraminal stenosis at L5-S1. 3. Up to mild lateral recess and neural foraminal stenosis at other levels.     Electronically Signed   By: Sebastian Ache M.D.   On: 05/25/2021 16:09 ---------  MR OF THE LEFT HIP WITHOUT CONTRAST   TECHNIQUE: Multiplanar, multisequence MR imaging was performed. No intravenous contrast was administered.   COMPARISON: X-ray 05/04/2019   FINDINGS: Bones: No acute fracture. No dislocation. No femoral head AVN. Degenerative changes of the bilateral sacroiliac joints, right slightly greater than left. Disc height loss with discogenic endplate marrow changes of the visualized lower lumbar spine, incompletely evaluated.   Articular cartilage and labrum   Articular cartilage: Cartilage thinning and surface irregularity most pronounced at the superior aspect of the left hip joint.   Labrum: Blunted, heterogeneous appearance of the anterosuperior labrum compatible with degenerative tearing.   Joint or bursal effusion  Joint effusion: None.   Bursae: Moderate left peritrochanteric bursal fluid.   Muscles and tendons   Muscles and tendons: Full-thickness avulsion tear of the left gluteus medius tendon from the greater trochanter (series 6, image 10) with minimal retraction. A portion of the posterior most tendon fibers remain attached.  Tendinosis with high-grade partial-thickness insertional and interstitial tears of the left gluteus minimus tendon. Hamstring, iliopsoas, and rectus femoris tendons are intact.   Other findings   Miscellaneous: None.   IMPRESSION: 1. Full-thickness avulsion tear of the left gluteus medius tendon from the greater trochanter. A portion of the posterior-most tendon fibers remain attached. 2. Tendinosis with high-grade partial-thickness insertional and interstitial tears of the left gluteus minimus tendon. 3. Moderate left peritrochanteric bursal fluid. 4. 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 5. Degenerative changes of the bilateral sacroiliac joints, right slightly greater than left.     Electronically Signed By: Duanne Guess M.D. On: 11/04/2019 08:46     Objective:  VS:  HT:    WT:   BMI:     BP:(!) 130/95  HR:68bpm  TEMP: ( )  RESP:  Physical Exam Vitals and nursing note reviewed.  Constitutional:      General: She is not in acute distress.    Appearance: Normal appearance. She is not ill-appearing.  HENT:     Head: Normocephalic and atraumatic.     Right Ear: External ear normal.     Left Ear: External ear normal.  Eyes:     Extraocular Movements: Extraocular movements intact.  Cardiovascular:     Rate and Rhythm: Normal rate.     Pulses: Normal pulses.  Pulmonary:     Effort: Pulmonary effort is normal. No respiratory distress.  Abdominal:     General: There is no distension.     Palpations: Abdomen is soft.  Musculoskeletal:        General: Tenderness present.     Cervical back: Neck supple.     Right lower leg: No edema.     Left lower leg: No edema.     Comments: Patient has good distal strength with no pain over the greater trochanters.  No clonus or focal weakness. Patient somewhat slow to rise from a seated position to full extension.  There is concordant low back pain with facet loading and lumbar  spine extension rotation.  There are no definitive trigger points but the patient is somewhat tender across the lower back and PSIS.  There is no pain with hip rotation.   Skin:    Findings: No erythema, lesion or rash.  Neurological:     General: No focal deficit present.     Mental Status: She is alert and oriented to person, place, and time.     Sensory: No sensory deficit.     Motor: No weakness or abnormal muscle tone.     Coordination: Coordination normal.  Psychiatric:        Mood and Affect: Mood normal.        Behavior: Behavior normal.     Imaging: No results found.

## 2021-08-27 NOTE — Procedures (Signed)
Lumbar Facet Joint Nerve Denervation  Patient: Chelsea Cline      Date of Birth: 11/02/1951 MRN: [id number] PCP: Mosetta Putt, MD      Visit Date: 08/22/2021   Universal Protocol:    Date/Time: 10/10/226:15 AM  Consent Given By: the patient  Position: PRONE  Additional Comments: Vital signs were monitored before and after the procedure. Patient was prepped and draped in the usual sterile fashion. The correct patient, procedure, and site was verified.   Injection Procedure Details:   Procedure diagnoses:  1. Spondylosis without myelopathy or radiculopathy, lumbar region      Meds Administered:  Meds ordered this encounter  Medications   betamethasone acetate-betamethasone sodium phosphate (CELESTONE) injection 12 mg     Laterality: Right  Location/Site:  L3-L4, L2 and L3 medial branches and L4-L5, L3 and L4 medial branches  Needle: 18 ga.,  93mm active tip, RF Cannula  Needle Placement: Along juncture of superior articular process and transverse pocess  Findings:  -Comments:  Procedure Details: For each desired target nerve, the corresponding transverse process (sacral ala for the L5 dorsal rami) was identified and the fluoroscope was positioned to square off the endplates of the corresponding vertebral body to achieve a true AP midline view.  The beam was then obliqued 15 to 20 degrees and caudally tilted 15 to 20 degrees to line up a trajectory along the target nerves. The skin over the target of the junction of superior articulating process and transverse process (sacral ala for the L5 dorsal rami) was infiltrated with 41ml of 1% Lidocaine without Epinephrine.  The 18 gauge 66mm active tip outer cannula was advanced in trajectory view to the target.  This procedure was repeated for each target nerve.  Then, for all levels, the outer cannula placement was fine-tuned and the position was then confirmed with bi-planar imaging.    Test stimulation was done both  at sensory and motor levels to ensure there was no radicular stimulation. The target tissues were then infiltrated with 1 ml of 1% Lidocaine without Epinephrine. Subsequently, a percutaneous neurotomy was carried out for 90 seconds at 80 degrees Celsius.  After the completion of the lesion, 1 ml of injectate was delivered. It was then repeated for each facet joint nerve mentioned above. Appropriate radiographs were obtained to verify the probe placement during the neurotomy.   Additional Comments:  No complications occurred Dressing: 2 x 2 sterile gauze and Band-Aid    Post-procedure details: Patient was observed during the procedure. Post-procedure instructions were reviewed.  Patient left the clinic in stable condition.

## 2021-08-30 ENCOUNTER — Encounter: Payer: Medicare Other | Admitting: Physical Medicine and Rehabilitation

## 2021-09-05 ENCOUNTER — Ambulatory Visit: Payer: Medicare Other | Admitting: Physical Medicine and Rehabilitation

## 2021-09-05 ENCOUNTER — Ambulatory Visit: Payer: Self-pay

## 2021-09-05 ENCOUNTER — Encounter: Payer: Self-pay | Admitting: Physical Medicine and Rehabilitation

## 2021-09-05 ENCOUNTER — Other Ambulatory Visit: Payer: Self-pay

## 2021-09-05 VITALS — BP 132/88 | HR 67

## 2021-09-05 DIAGNOSIS — M47816 Spondylosis without myelopathy or radiculopathy, lumbar region: Secondary | ICD-10-CM

## 2021-09-05 MED ORDER — BETAMETHASONE SOD PHOS & ACET 6 (3-3) MG/ML IJ SUSP
12.0000 mg | Freq: Once | INTRAMUSCULAR | Status: AC
  Administered 2021-09-05: 12 mg

## 2021-09-05 NOTE — Progress Notes (Signed)
Pt state lower back pain. Pt state bending ad getting up from sitting makes the pain worse. Pt state PT and heating and ice helps ease help pain.  Numeric Pain Rating Scale and Functional Assessment Average Pain 2   In the last MONTH (on 0-10 scale) has pain interfered with the following?  1. General activity like being  able to carry out your everyday physical activities such as walking, climbing stairs, carrying groceries, or moving a chair?  Rating(4)   +Driver, -BT, -Dye Allergies.

## 2021-09-05 NOTE — Patient Instructions (Signed)

## 2021-09-18 NOTE — Procedures (Signed)
Lumbar Facet Joint Nerve Denervation  Patient: Chelsea Cline      Date of Birth: [birth year] MRN: [id number] PCP: Mosetta Putt, MD      Visit Date: 09/05/2021   Universal Protocol:    Date/Time: 11/01/225:26 AM  Consent Given By: the patient  Position: PRONE  Additional Comments: Vital signs were monitored before and after the procedure. Patient was prepped and draped in the usual sterile fashion. The correct patient, procedure, and site was verified.   Injection Procedure Details:   Procedure diagnoses:  1. Spondylosis without myelopathy or radiculopathy, lumbar region      Meds Administered:  Meds ordered this encounter  Medications   betamethasone acetate-betamethasone sodium phosphate (CELESTONE) injection 12 mg     Laterality: Left  Location/Site:  L3-L4, L2 and L3 medial branches and L4-L5, L3 and L4 medial branches  Needle: 18 ga.,  23mm active tip, RF Cannula  Needle Placement: Along juncture of superior articular process and transverse pocess  Findings:  -Comments:  Procedure Details: For each desired target nerve, the corresponding transverse process (sacral ala for the L5 dorsal rami) was identified and the fluoroscope was positioned to square off the endplates of the corresponding vertebral body to achieve a true AP midline view.  The beam was then obliqued 15 to 20 degrees and caudally tilted 15 to 20 degrees to line up a trajectory along the target nerves. The skin over the target of the junction of superior articulating process and transverse process (sacral ala for the L5 dorsal rami) was infiltrated with 30ml of 1% Lidocaine without Epinephrine.  The 18 gauge 63mm active tip outer cannula was advanced in trajectory view to the target.  This procedure was repeated for each target nerve.  Then, for all levels, the outer cannula placement was fine-tuned and the position was then confirmed with bi-planar imaging.    Test stimulation was done both  at sensory and motor levels to ensure there was no radicular stimulation. The target tissues were then infiltrated with 1 ml of 1% Lidocaine without Epinephrine. Subsequently, a percutaneous neurotomy was carried out for 90 seconds at 80 degrees Celsius.  After the completion of the lesion, 1 ml of injectate was delivered. It was then repeated for each facet joint nerve mentioned above. Appropriate radiographs were obtained to verify the probe placement during the neurotomy.   Additional Comments:  No complications occurred Dressing: 2 x 2 sterile gauze and Band-Aid    Post-procedure details: Patient was observed during the procedure. Post-procedure instructions were reviewed.  Patient left the clinic in stable condition.

## 2021-09-18 NOTE — Progress Notes (Signed)
Chelsea Cline - 70 y.o. female MRN [id number]  Date of birth: [birth year]  Office Visit Note: Visit Date: 09/05/2021 PCP: Derinda Late, MD Referred by: Derinda Late, MD  Subjective: Chief Complaint  Patient presents with   Lower Back - Pain   HPI:  Chelsea Cline is a 70 y.o. female who comes in todayfor planned radiofrequency ablation of the Left L3-4 and L4-5 Lumbar facet joints. This would be ablation of the corresponding medial branches and/or dorsal rami.  Patient has had double diagnostic blocks with more than 50% relief.  These are documented on pain diary.  They have had chronic back pain for quite some time, more than 3 months, which has been an ongoing situation with recalcitrant axial back pain.  They have no radicular pain.  Their axial pain is worse with standing and ambulating and on exam today with facet loading.  They have had physical therapy as well as home exercise program.  The imaging noted in the chart below indicated facet pathology. Accordingly they meet all the criteria and qualification for for radiofrequency ablation and we are going to complete this today hopefully for more longer term relief as part of comprehensive management program.   ROS Otherwise per HPI.  Assessment & Plan: Visit Diagnoses:    ICD-10-CM   1. Spondylosis without myelopathy or radiculopathy, lumbar region  M47.816 XR C-ARM NO REPORT    betamethasone acetate-betamethasone sodium phosphate (CELESTONE) injection 12 mg    Radiofrequency,Lumbar      Plan: No additional findings.   Meds & Orders:  Meds ordered this encounter  Medications   betamethasone acetate-betamethasone sodium phosphate (CELESTONE) injection 12 mg    Orders Placed This Encounter  Procedures   Radiofrequency,Lumbar   XR C-ARM NO REPORT    Follow-up: Return if symptoms worsen or fail to improve.   Procedures: No procedures performed  Lumbar Facet Joint Nerve Denervation  Patient: Chelsea Cline      Date of Birth: 1951-07-21 MRN: [id number] PCP: Derinda Late, MD      Visit Date: 09/05/2021   Universal Protocol:    Date/Time: 11/01/225:26 AM  Consent Given By: the patient  Position: PRONE  Additional Comments: Vital signs were monitored before and after the procedure. Patient was prepped and draped in the usual sterile fashion. The correct patient, procedure, and site was verified.   Injection Procedure Details:   Procedure diagnoses:  1. Spondylosis without myelopathy or radiculopathy, lumbar region      Meds Administered:  Meds ordered this encounter  Medications   betamethasone acetate-betamethasone sodium phosphate (CELESTONE) injection 12 mg     Laterality: Left  Location/Site:  L3-L4, L2 and L3 medial branches and L4-L5, L3 and L4 medial branches  Needle: 18 ga.,  24mm active tip, 159mm RF Cannula  Needle Placement: Along juncture of superior articular process and transverse pocess  Findings:  -Comments:  Procedure Details: For each desired target nerve, the corresponding transverse process (sacral ala for the L5 dorsal rami) was identified and the fluoroscope was positioned to square off the endplates of the corresponding vertebral body to achieve a true AP midline view.  The beam was then obliqued 15 to 20 degrees and caudally tilted 15 to 20 degrees to line up a trajectory along the target nerves. The skin over the target of the junction of superior articulating process and transverse process (sacral ala for the L5 dorsal rami) was infiltrated with 3ml of 1% Lidocaine without Epinephrine.  The 18  gauge 54mm active tip outer cannula was advanced in trajectory view to the target.  This procedure was repeated for each target nerve.  Then, for all levels, the outer cannula placement was fine-tuned and the position was then confirmed with bi-planar imaging.    Test stimulation was done both at sensory and motor levels to ensure there was no  radicular stimulation. The target tissues were then infiltrated with 1 ml of 1% Lidocaine without Epinephrine. Subsequently, a percutaneous neurotomy was carried out for 90 seconds at 80 degrees Celsius.  After the completion of the lesion, 1 ml of injectate was delivered. It was then repeated for each facet joint nerve mentioned above. Appropriate radiographs were obtained to verify the probe placement during the neurotomy.   Additional Comments:  No complications occurred Dressing: 2 x 2 sterile gauze and Band-Aid    Post-procedure details: Patient was observed during the procedure. Post-procedure instructions were reviewed.  Patient left the clinic in stable condition.      Clinical History: MRI LUMBAR SPINE WITHOUT CONTRAST   TECHNIQUE: Multiplanar, multisequence MR imaging of the lumbar spine was performed. No intravenous contrast was administered.   COMPARISON:  Lumbar spine radiographs 05/08/2021   FINDINGS: Segmentation:  Standard.   Alignment:  Trace retrolisthesis of L2 on L3.   Vertebrae: No fracture or suspicious marrow lesion. Moderate degenerative endplate edema at [id number], greater on the right. Mildly heterogeneous bone marrow signal diffusely with scattered small hemangiomas.   Conus medullaris and cauda equina: Conus extends to the L1-2 level. Conus and cauda equina appear normal.   Paraspinal and other soft tissues: Left renal sinus cysts.   Disc levels:   Disc desiccation and severe disc space narrowing from L2-3 to L5-S1.   T12-L1: Shallow right paracentral and subarticular disc protrusion without stenosis.   L1-2: Mild facet hypertrophy without disc herniation or stenosis.   L2-3: Circumferential disc osteophyte complex and moderate facet and ligamentum flavum hypertrophy result in mild bilateral lateral recess stenosis without significant generalized spinal stenosis or neural foraminal stenosis.   L3-4: Circumferential disc osteophyte complex  and moderate facet and ligamentum flavum hypertrophy result in minimal to mild bilateral neural foraminal stenosis without spinal stenosis.   L4-5: Disc bulging, endplate spurring, and mild facet and ligamentum flavum hypertrophy result in mild left lateral recess stenosis and mild left neural foraminal stenosis without spinal stenosis.   L5-S1: Left eccentric disc bulging, endplate spurring, and mild facet hypertrophy result in moderate left and mild right neural foraminal stenosis without spinal stenosis.   IMPRESSION: 1. 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 2. Moderate left neural foraminal stenosis at L5-S1. 3. Up to mild lateral recess and neural foraminal stenosis at other levels.     Electronically Signed   By: Logan Bores M.D.   On: 05/25/2021 16:09 ---------  MR OF THE LEFT HIP WITHOUT CONTRAST   TECHNIQUE: Multiplanar, multisequence MR imaging was performed. No intravenous contrast was administered.   COMPARISON: X-ray 05/04/2019   FINDINGS: Bones: No acute fracture. No dislocation. No femoral head AVN. Degenerative changes of the bilateral sacroiliac joints, right slightly greater than left. Disc height loss with discogenic endplate marrow changes of the visualized lower lumbar spine, incompletely evaluated.   Articular cartilage and labrum   Articular cartilage: Cartilage thinning and surface irregularity most pronounced at the superior aspect of the left hip joint.   Labrum: Blunted, heterogeneous appearance of the anterosuperior labrum compatible with degenerative tearing.   Joint or bursal effusion  Joint effusion: None.   Bursae: Moderate left peritrochanteric bursal fluid.   Muscles and tendons   Muscles and tendons: Full-thickness avulsion tear of the left gluteus medius tendon from the greater trochanter (series 6, image 10) with minimal retraction. A portion of the posterior most tendon fibers remain attached.  Tendinosis with high-grade partial-thickness insertional and interstitial tears of the left gluteus minimus tendon. Hamstring, iliopsoas, and rectus femoris tendons are intact.   Other findings   Miscellaneous: None.   IMPRESSION: 1. Full-thickness avulsion tear of the left gluteus medius tendon from the greater trochanter. A portion of the posterior-most tendon fibers remain attached. 2. Tendinosis with high-grade partial-thickness insertional and interstitial tears of the left gluteus minimus tendon. 3. Moderate left peritrochanteric bursal fluid. 4. 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 5. Degenerative changes of the bilateral sacroiliac joints, right slightly greater than left.     Electronically Signed By: Duanne Guess M.D. On: 11/04/2019 08:46     Objective:  VS:  HT:    WT:   BMI:     BP:132/88  HR:67bpm  TEMP: ( )  RESP:  Physical Exam Vitals and nursing note reviewed.  Constitutional:      General: She is not in acute distress.    Appearance: Normal appearance. She is not ill-appearing.  HENT:     Head: Normocephalic and atraumatic.     Right Ear: External ear normal.     Left Ear: External ear normal.  Eyes:     Extraocular Movements: Extraocular movements intact.  Cardiovascular:     Rate and Rhythm: Normal rate.     Pulses: Normal pulses.  Pulmonary:     Effort: Pulmonary effort is normal. No respiratory distress.  Abdominal:     General: There is no distension.     Palpations: Abdomen is soft.  Musculoskeletal:        General: Tenderness present.     Cervical back: Neck supple.     Right lower leg: No edema.     Left lower leg: No edema.     Comments: Patient has good distal strength with no pain over the greater trochanters.  No clonus or focal weakness. Patient somewhat slow to rise from a seated position to full extension.  There is concordant low back pain with facet loading and lumbar spine  extension rotation.  There are no definitive trigger points but the patient is somewhat tender across the lower back and PSIS.  There is no pain with hip rotation.   Skin:    Findings: No erythema, lesion or rash.  Neurological:     General: No focal deficit present.     Mental Status: She is alert and oriented to person, place, and time.     Sensory: No sensory deficit.     Motor: No weakness or abnormal muscle tone.     Coordination: Coordination normal.  Psychiatric:        Mood and Affect: Mood normal.        Behavior: Behavior normal.     Imaging: No results found.

## 2021-09-21 ENCOUNTER — Encounter: Payer: Self-pay | Admitting: Physical Medicine and Rehabilitation

## 2021-09-21 DIAGNOSIS — M47816 Spondylosis without myelopathy or radiculopathy, lumbar region: Secondary | ICD-10-CM

## 2021-10-03 ENCOUNTER — Encounter: Payer: Self-pay | Admitting: Physical Medicine and Rehabilitation

## 2021-10-05 ENCOUNTER — Telehealth: Payer: Self-pay | Admitting: Physical Medicine and Rehabilitation

## 2021-10-05 NOTE — Telephone Encounter (Signed)
Patient called. She is returning a call to [name].

## 2021-10-05 NOTE — Telephone Encounter (Signed)
Scheduled for MRI review. 

## 2021-10-05 NOTE — Telephone Encounter (Signed)
Left message #1 to schedule OV for MRI review. 

## 2021-10-08 ENCOUNTER — Ambulatory Visit
Admission: RE | Admit: 2021-10-08 | Discharge: 2021-10-08 | Disposition: A | Discharge status: Home / Self Care | Payer: Medicare Other | Source: Ambulatory Visit | Attending: Physical Medicine and Rehabilitation | Admitting: Physical Medicine and Rehabilitation

## 2021-10-08 ENCOUNTER — Other Ambulatory Visit: Payer: Self-pay

## 2021-10-08 DIAGNOSIS — M47816 Spondylosis without myelopathy or radiculopathy, lumbar region: Secondary | ICD-10-CM

## 2021-10-10 ENCOUNTER — Telehealth: Payer: Self-pay | Admitting: Physical Medicine and Rehabilitation

## 2021-10-16 ENCOUNTER — Ambulatory Visit: Payer: Medicare Other | Admitting: Physical Medicine and Rehabilitation

## 2021-10-16 ENCOUNTER — Other Ambulatory Visit: Payer: Self-pay

## 2021-10-16 ENCOUNTER — Encounter: Payer: Self-pay | Admitting: Physical Medicine and Rehabilitation

## 2021-10-16 VITALS — BP 140/89 | HR 67

## 2021-10-16 DIAGNOSIS — M5116 Intervertebral disc disorders with radiculopathy, lumbar region: Secondary | ICD-10-CM

## 2021-10-16 DIAGNOSIS — M47816 Spondylosis without myelopathy or radiculopathy, lumbar region: Secondary | ICD-10-CM

## 2021-10-16 DIAGNOSIS — M5416 Radiculopathy, lumbar region: Secondary | ICD-10-CM

## 2021-10-16 NOTE — Progress Notes (Signed)
Pt state she still having tingling and numbness in her left leg and pain in her left hip. Pt state not taking anything for it discomfort. Pt here today to review her MRI. Pt has hx of inj on 09/05/21 pt state it has helped.  Numeric Pain Rating Scale and Functional Assessment Average Pain 0   In the last MONTH (on 0-10 scale) has pain interfered with the following?  1. General activity like being  able to carry out your everyday physical activities such as walking, climbing stairs, carrying groceries, or moving a chair?  Rating(2)

## 2021-10-16 NOTE — Telephone Encounter (Signed)
Follow up ov ok

## 2021-10-16 NOTE — Progress Notes (Signed)
Chelsea Cline - 70 y.o. female MRN [id number]  Date of birth: 1951-02-20  Office Visit Note: Visit Date: 10/16/2021 PCP: Mosetta Putt, MD Referred by: Mosetta Putt, MD  Subjective: Chief Complaint  Patient presents with   Lower Back - Pain   HPI: Chelsea Cline is a 70 y.o. female who comes in today for evaluation of chronic, worsening and severe left lateral thigh pain radiating down anterior leg to foot. Patient had bilateral L3-L4 and L4-L5 radiofrequency ablation on 09/05/2021 that significantly helped her lower back pain and continues to sustain, however patient reports left leg issues started shortly after radiofrequency ablation. Patient reports pain is exacerbated by prolonged sitting, describes as numbness/tingling and shooting in nature. Patient currently rates pain as 7 out of 10. Patient's recent lumbar MRI was obtained after radiofrequency ablation and does not exhibit significant changes from July. She does have multi-level degenerative changes and left lateral disc bulge at L5-S1 abutting the extraforaminal left L5 nerve root. Patient has attended formal physical therapy at [facility] in [place], [place] in the past and reports this did help to alleviate her pain. Patient states she is normally a very active person, however recently she has not been able to play tennis due to severe pain. Patient denies focal weakness, numbness and tingling. Patient denies recent trauma or falls.     Review of Systems  Musculoskeletal:        Pt reports left lateral thigh pain radiating down anterior leg to foot.  Neurological:  Positive for tingling. Negative for focal weakness and weakness.  All other systems reviewed and are negative. Otherwise per HPI.  Assessment & Plan: Visit Diagnoses:    ICD-10-CM   1. Lumbar radiculopathy  M54.16 Ambulatory referral to Physical Medicine Rehab    2. Intervertebral disc disorders with radiculopathy, lumbar region  M51.16     3. Facet  hypertrophy of lumbar region  M47.816        Plan: Findings:  Chronic, worsening and severe left lateral thigh pain radiating down anterior leg to foot. Significant and sustained pain relief of bilateral lower back pain since radiofrequency ablation in October. Patient now has have left leg symptoms despite good conservative therapies such as formal physical therapy, home exercise regimen and rest. Patient's clinical presentation and exam are consistent with classic L5 nerve pattern with paresthesias in an L5 distribution.  Because this pain started up after the ablation we did obtain new MRI.  Patient's new lumbar MRI is clinically unchanged compared to imaging from July but does show a lateral disc herniation at L5-S1 on the left consistent with her pain pattern. We believe the next step is to perform a diagnostic and hopefully therapeutic left L5 transforaminal epidural steroid injection. We do realize that patient travels to our office from the [place] [facility] [place] and will work with her on scheduling to ensure we can get in her quickly for injection. Patient encouraged to continue being active and to perform home exercises as tolerated. We also discussed re-grouping with physical therapy, however she would like to hold off until after the injection is completed. No red flag symptoms noted upon exam today.    Meds & Orders: No orders of the defined types were placed in this encounter.   Orders Placed This Encounter  Procedures   Ambulatory referral to Physical Medicine Rehab     Follow-up: Return for Left L5 transforaminal epidural steroid injection.   Procedures: No procedures performed  Clinical History: MRI LUMBAR SPINE WITHOUT CONTRAST   TECHNIQUE: Multiplanar, multisequence MR imaging of the lumbar spine was performed. No intravenous contrast was administered.   COMPARISON:  MR lumbar 05/25/2021   FINDINGS: Segmentation:  Standard.   Alignment:  Stable.   Vertebrae:  Stable vertebral body heights. Marrow signal is mildly heterogeneous, similar to prior. Decreased degenerative endplate marrow edema at L2-L3. A few small vertebral body hemangiomas are again noted. No suspicious osseous lesion.   Conus medullaris and cauda equina: Conus extends to the L1-L2 level. Conus and cauda equina appear normal.   Paraspinal and other soft tissues: Unremarkable.   Disc levels:   L1-L2:  No canal or foraminal stenosis.   L2-L3: Disc bulge with endplate osteophytic ridging. Minor canal stenosis. Slight effacement of subarticular recesses. Minor foraminal stenosis, right greater than left. Appearance is similar.   L3-L4: Disc bulge with endplate osteophytic ridging. Facet arthropathy with ligamentum flavum infolding. No canal stenosis. Mild foraminal stenosis. Appearance is similar.   L4-L5: Disc bulge with endplate osteophytic ridging. Facet arthropathy with ligamentum flavum infolding. No canal stenosis. Minor, left greater than right, foraminal stenosis. Appearance is similar.   L5-S1: Disc bulge with endplate osteophytic ridging. Facet arthropathy with ligamentum flavum infolding. No canal stenosis. Mild right and moderate left foraminal stenosis. Left far lateral component of disc bulge abuts the extraforaminal left L5 nerve root. Appearance is similar.   IMPRESSION: Multilevel degenerative changes as detailed above without progression since 05/25/2021 examination. Left far lateral component of L5-S1 disc bulge abuts the extraforaminal left L5 nerve root.     Electronically Signed   By: Guadlupe Spanish M.D.   On: 10/10/2021 10:34   She reports that she has never smoked. She has never used smokeless tobacco. No results for input(s): HGBA1C, LABURIC in the last 8760 hours.  Objective:  VS:  HT:    WT:   BMI:     BP:140/89  HR:67bpm  TEMP: ( )  RESP:  Physical Exam Vitals and nursing note reviewed.  HENT:     Head: Normocephalic and  atraumatic.     Right Ear: External ear normal.     Left Ear: External ear normal.     Nose: Nose normal.     Mouth/Throat:     Mouth: Mucous membranes are moist.  Eyes:     Extraocular Movements: Extraocular movements intact.  Cardiovascular:     Rate and Rhythm: Normal rate.     Pulses: Normal pulses.  Pulmonary:     Effort: Pulmonary effort is normal.  Abdominal:     General: Abdomen is flat. There is no distension.  Musculoskeletal:        General: Tenderness present.     Cervical back: Normal range of motion.     Comments: Pt rises from seated position to standing without difficulty. Good lumbar range of motion. Strong distal strength without clonus, no pain upon palpation of greater trochanters. Sensation intact bilaterally. Dysesthesias noted to left L5 dermatome. Walks independently, gait steady.   Skin:    General: Skin is warm and dry.     Capillary Refill: Capillary refill takes less than 2 seconds.  Neurological:     General: No focal deficit present.     Mental Status: She is alert and oriented to person, place, and time.  Psychiatric:        Mood and Affect: Mood normal.    Ortho Exam  Imaging: No results found.  Past Medical/Family/Surgical/Social History: Medications & Allergies  reviewed per EMR, new medications updated. 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   Elevated alkaline phosphatase level 2006   H/O bone density study 12/11/2011   Osteopenia   H/O mammogram 12/13/2014    Hyperlipidemia 2013   Insomnia    Several years   Ocular migraine 2004   Rosacea 2014   Family History  Problem Relation Age of Onset   Stroke Mother    Cancer - Other Mother        Kidney   Diabetes Father        Died in his sleep age 12 of unkonwn cause.    Hypertension Father    Past Surgical History:  Procedure Laterality Date   CATARACT EXTRACTION Left 2006   Lens Implant   COLONOSCOPY  06/12/2005   Showed minimal diverticulosis and a Hyperplastic polyp.   EYE MUSCLE SURGERY Left 2006   for repair of a macular pucker   KNEE SURGERY Left 1996   for Chondromalacia   Social History   Occupational History   Not on file  Tobacco Use   Smoking status: Never   Smokeless tobacco: Never  Vaping Use   Vaping Use: Never used  Substance and Sexual Activity   Alcohol use: Yes    Alcohol/week: 5.0 standard drinks    Types: 5 Glasses of wine per week   Drug use: No   Sexual activity: Not on file

## 2021-11-16 ENCOUNTER — Encounter: Payer: Self-pay | Admitting: Physical Medicine and Rehabilitation

## 2021-11-22 ENCOUNTER — Ambulatory Visit: Payer: Medicare Other | Admitting: Physical Medicine and Rehabilitation

## 2022-08-26 ENCOUNTER — Other Ambulatory Visit: Payer: Self-pay | Admitting: Family Medicine

## 2022-08-26 DIAGNOSIS — M25552 Pain in left hip: Secondary | ICD-10-CM

## 2022-08-27 ENCOUNTER — Ambulatory Visit
Admission: RE | Admit: 2022-08-27 | Discharge: 2022-08-27 | Disposition: A | Discharge status: Home / Self Care | Payer: Medicare Other | Source: Ambulatory Visit | Attending: Family Medicine | Admitting: Family Medicine

## 2022-08-27 DIAGNOSIS — M25552 Pain in left hip: Secondary | ICD-10-CM

## 2023-03-15 IMAGING — MR MR LUMBAR SPINE W/O CM
4 of 5 series · 27 of 48 positions shown · non-contrast
Comparison: MR lumbar 05/25/2021

CLINICAL DATA: Low back pain with left hip and leg weakness,
tingling and numbness off and on for 3-4 weeks.

EXAM:
MRI LUMBAR SPINE WITHOUT CONTRAST
TECHNIQUE: Multiplanar, multisequence MR imaging of the lumbar spine was
performed. No intravenous contrast was administered.

[Series 3: T2 · sagittal · 4.0mm · 1.09mm/px · 6 of 15 slices shown (1 of 2)]
[im 1/15]
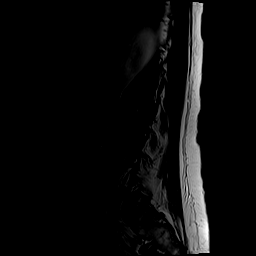
[im 3/15]
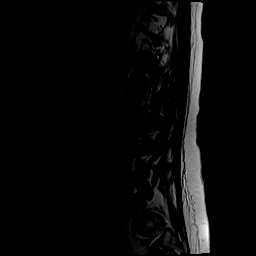
[im 6/15]
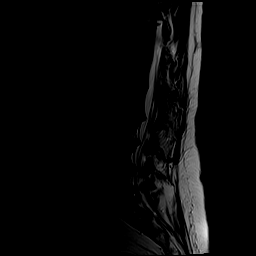
[im 9/15]
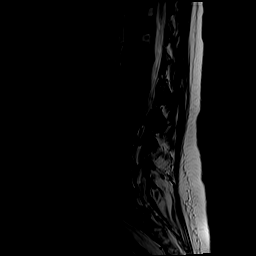
[im 12/15]
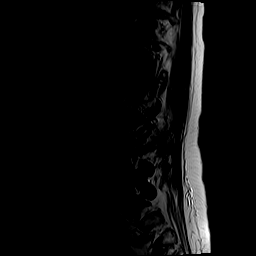
[im 15/15]
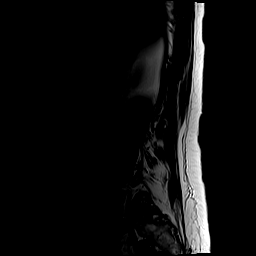

[Series 5: T1 · sagittal · 4.0mm · 1.09mm/px · 6 of 15 slices shown (1 of 2)]
[im 1/15]
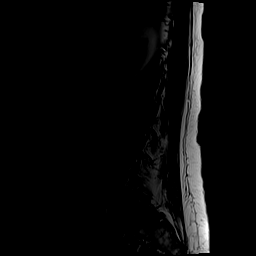
[im 3/15]
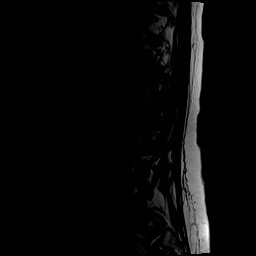
[im 6/15]
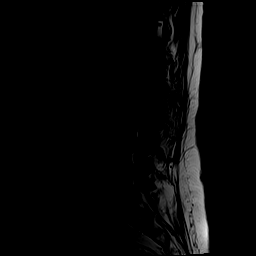
[im 9/15]
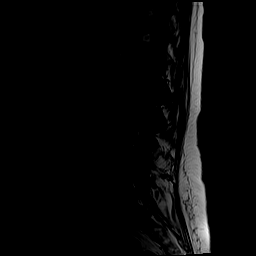
[im 12/15]
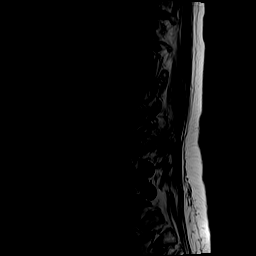
[im 15/15]
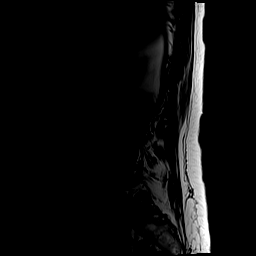

[Series 6: T2 · axial · 4.0mm · 0.39mm/px · z∈[-0,+199]mm · 9 of 38 slices shown (2 of 2)]
[im 1/38]
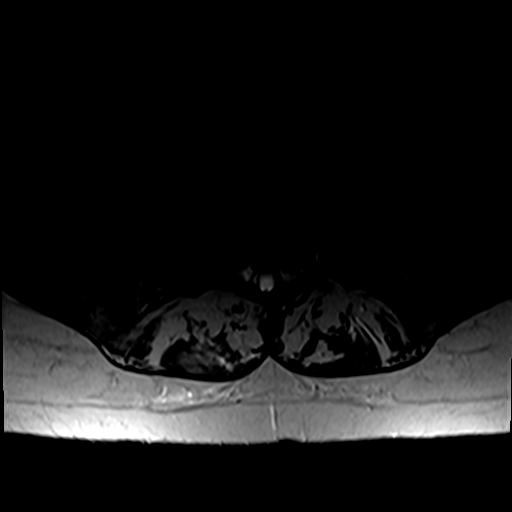
[im 6/38]
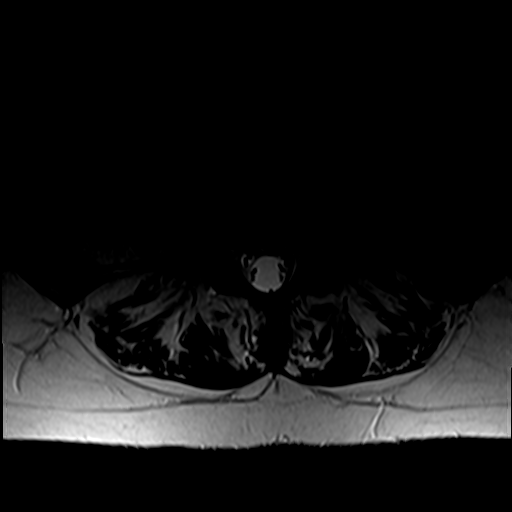
[im 11/38]
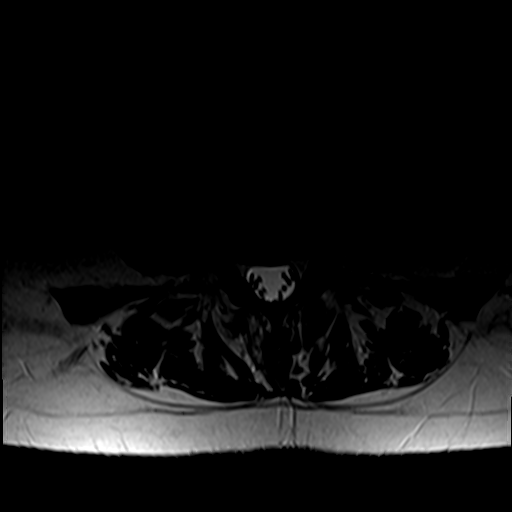
[im 16/38]
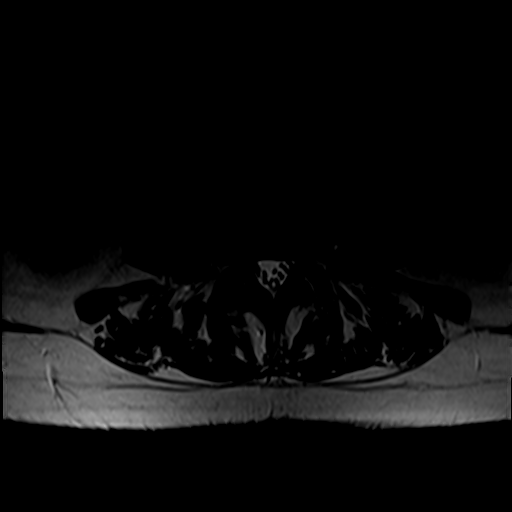
[im 19/38]
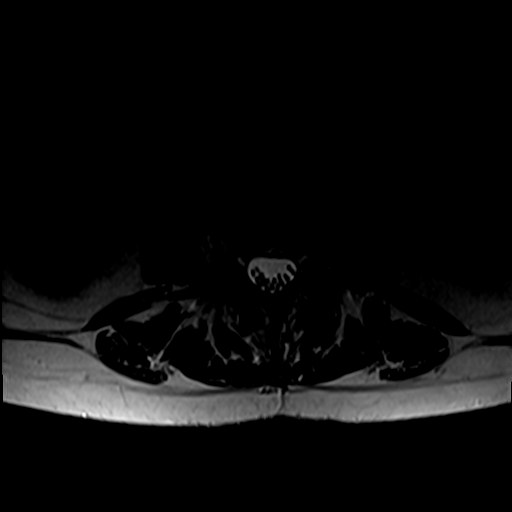
[im 22/38]
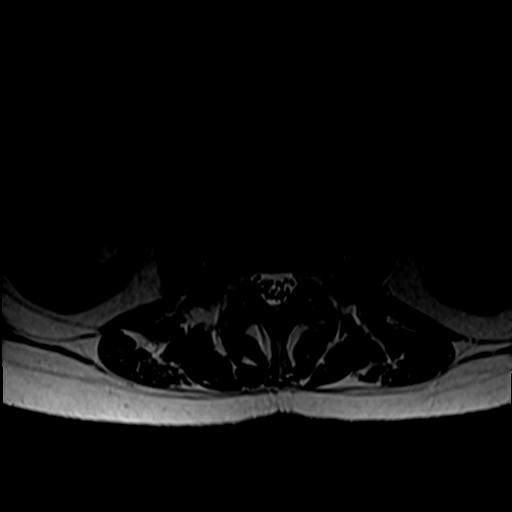
[im 27/38]
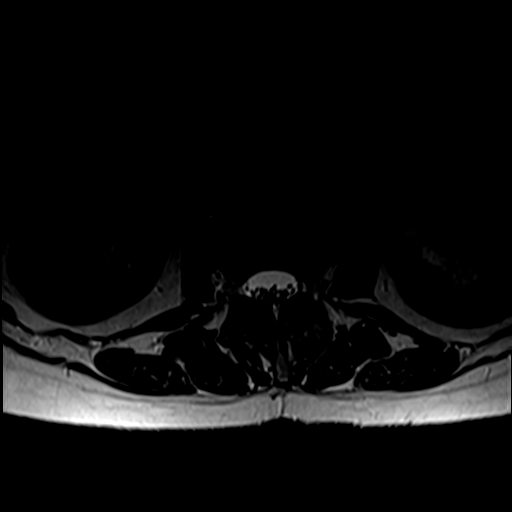
[im 32/38]
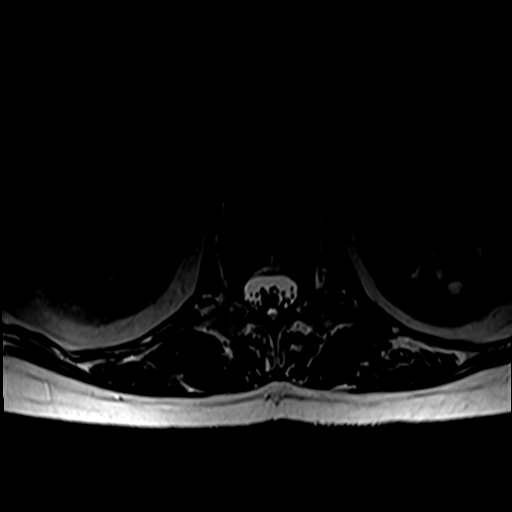
[im 38/38]
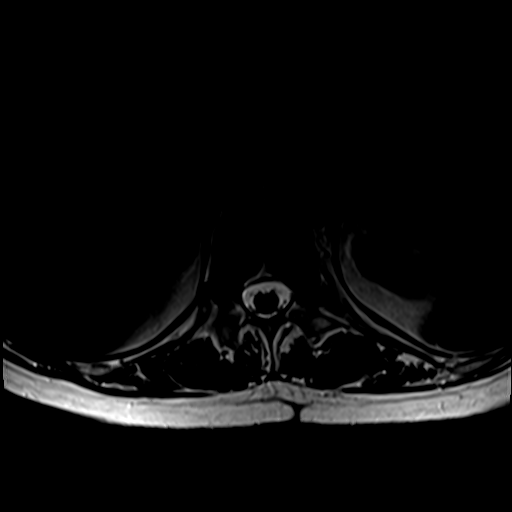

[Series 7: T1 · axial · 4.0mm · 0.39mm/px · z∈[-0,+171]mm · 6 of 38 slices shown (2 of 2)]
[im 1/38]
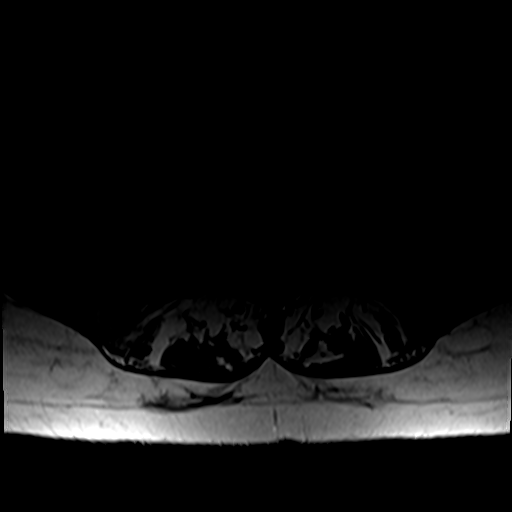
[im 6/38]
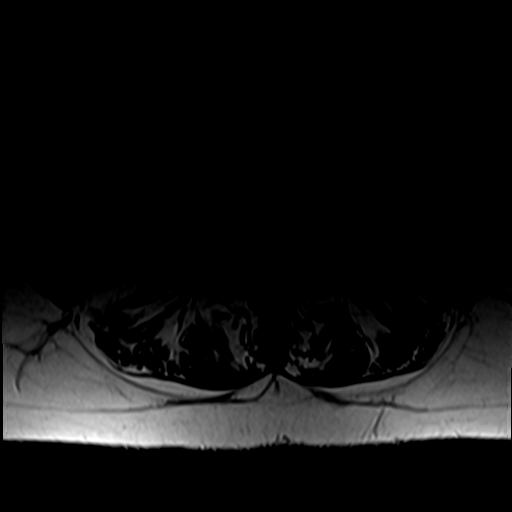
[im 11/38]
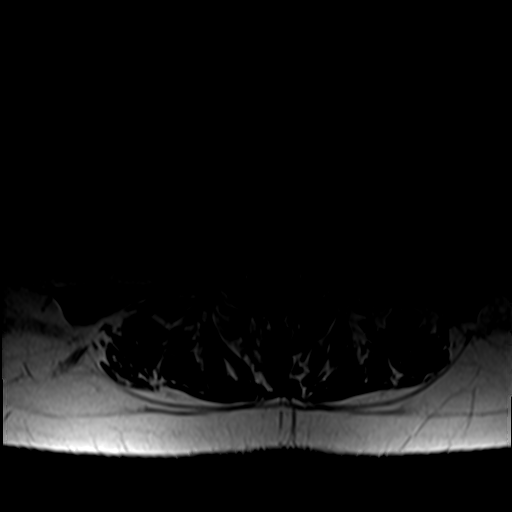
[im 16/38]
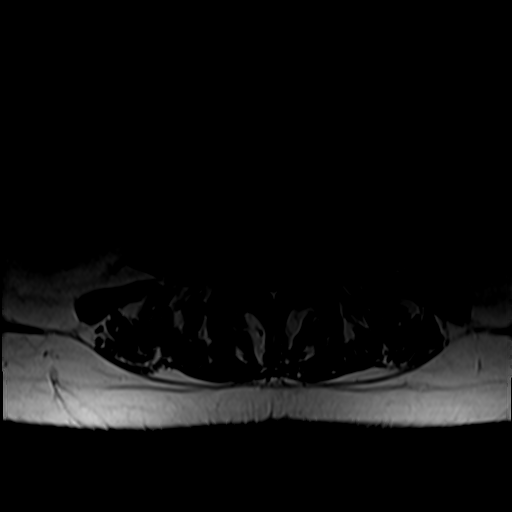
[im 19/38]
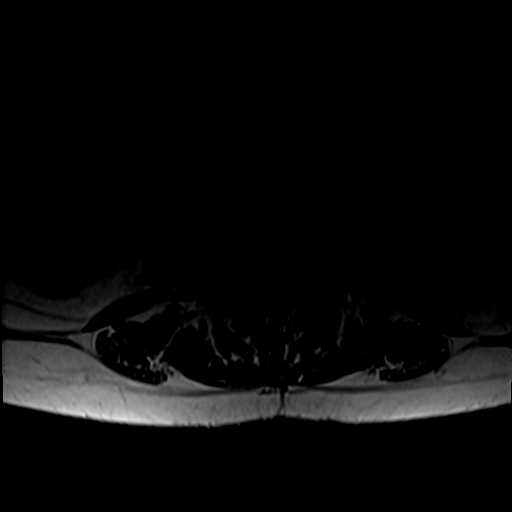
[im 32/38]
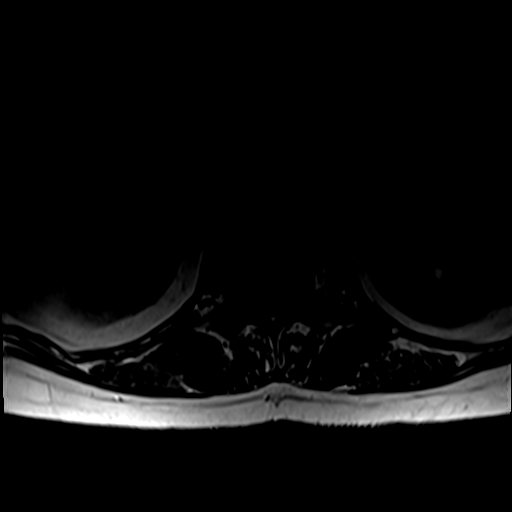

[27 of 48 positions shown; findings below may reference images not displayed]

FINDINGS: Segmentation:  Standard.

Alignment:  Stable.

Vertebrae: Stable vertebral body heights. Marrow signal is mildly
heterogeneous, similar to prior. Decreased degenerative endplate
marrow edema at L2-L3. A few small vertebral body hemangiomas are
again noted. No suspicious osseous lesion.

Conus medullaris and cauda equina: Conus extends to the L1-L2 level.
Conus and cauda equina appear normal.

Paraspinal and other soft tissues: Unremarkable.

Disc levels:

L1-L2:  No canal or foraminal stenosis.

L2-L3: Disc bulge with endplate osteophytic ridging. Minor canal
stenosis. Slight effacement of subarticular recesses. Minor
foraminal stenosis, right greater than left. Appearance is similar.

L3-L4: Disc bulge with endplate osteophytic ridging. Facet
arthropathy with ligamentum flavum infolding. No canal stenosis.
Mild foraminal stenosis. Appearance is similar.

L4-L5: Disc bulge with endplate osteophytic ridging. Facet
arthropathy with ligamentum flavum infolding. No canal stenosis.
Minor, left greater than right, foraminal stenosis. Appearance is
similar.

L5-S1: Disc bulge with endplate osteophytic ridging. Facet
arthropathy with ligamentum flavum infolding. No canal stenosis.
Mild right and moderate left foraminal stenosis. Left far lateral
component of disc bulge abuts the extraforaminal left L5 nerve root.
Appearance is similar.
IMPRESSION: Multilevel degenerative changes as detailed above without
progression since 05/25/2021 examination. Left far lateral component
of L5-S1 disc bulge abuts the extraforaminal left L5 nerve root.

## 2024-11-08 ENCOUNTER — Other Ambulatory Visit: Payer: Self-pay | Admitting: Family Medicine

## 2024-11-08 ENCOUNTER — Ambulatory Visit
Admission: RE | Admit: 2024-11-08 | Discharge: 2024-11-08 | Disposition: A | Source: Ambulatory Visit | Attending: Family Medicine | Admitting: Family Medicine

## 2024-11-08 DIAGNOSIS — M25532 Pain in left wrist: Secondary | ICD-10-CM
